# Patient Record
Sex: Female | Born: 1955
Health system: Southern US, Community
[De-identification: ages and names within clinical notes are randomized; demographics above are authoritative.]

## PROBLEM LIST (undated history)

## (undated) DIAGNOSIS — Z9889 Other specified postprocedural states: Secondary | ICD-10-CM

## (undated) DIAGNOSIS — R112 Nausea with vomiting, unspecified: Secondary | ICD-10-CM

## (undated) DIAGNOSIS — F32A Depression, unspecified: Secondary | ICD-10-CM

## (undated) DIAGNOSIS — R519 Headache, unspecified: Secondary | ICD-10-CM

## (undated) DIAGNOSIS — A6000 Herpesviral infection of urogenital system, unspecified: Secondary | ICD-10-CM

## (undated) DIAGNOSIS — D649 Anemia, unspecified: Secondary | ICD-10-CM

## (undated) DIAGNOSIS — F329 Major depressive disorder, single episode, unspecified: Secondary | ICD-10-CM

## (undated) DIAGNOSIS — K219 Gastro-esophageal reflux disease without esophagitis: Secondary | ICD-10-CM

## (undated) DIAGNOSIS — Z803 Family history of malignant neoplasm of breast: Secondary | ICD-10-CM

## (undated) DIAGNOSIS — R51 Headache: Secondary | ICD-10-CM

## (undated) HISTORY — DX: Family history of malignant neoplasm of breast: Z80.3

## (undated) HISTORY — DX: Depression, unspecified: F32.A

## (undated) HISTORY — PX: FOOT SURGERY: SHX648

## (undated) HISTORY — DX: Gastro-esophageal reflux disease without esophagitis: K21.9

## (undated) HISTORY — PX: WRIST SURGERY: SHX841

## (undated) HISTORY — DX: Herpesviral infection of urogenital system, unspecified: A60.00

## (undated) HISTORY — DX: Major depressive disorder, single episode, unspecified: F32.9

---

## 2007-11-14 ENCOUNTER — Emergency Department: Payer: Self-pay | Admitting: Emergency Medicine

## 2007-11-14 ENCOUNTER — Other Ambulatory Visit: Payer: Self-pay

## 2008-02-13 ENCOUNTER — Ambulatory Visit: Payer: Self-pay | Admitting: Gastroenterology

## 2008-12-23 ENCOUNTER — Ambulatory Visit: Payer: Self-pay | Admitting: Anesthesiology

## 2013-01-09 ENCOUNTER — Emergency Department: Payer: Self-pay | Admitting: Emergency Medicine

## 2013-01-09 LAB — URINALYSIS, COMPLETE
Bacteria: NONE SEEN
Bilirubin,UR: NEGATIVE
Glucose,UR: NEGATIVE mg/dL (ref 0–75)
Ketone: NEGATIVE
Nitrite: NEGATIVE
Ph: 5 (ref 4.5–8.0)
Protein: NEGATIVE
RBC,UR: 6 /HPF (ref 0–5)
Specific Gravity: 1.028 (ref 1.003–1.030)
Squamous Epithelial: 1
WBC UR: 6 /HPF (ref 0–5)

## 2013-01-09 LAB — CBC
HCT: 38.1 % (ref 35.0–47.0)
HGB: 13.2 g/dL (ref 12.0–16.0)
MCH: 30 pg (ref 26.0–34.0)
MCHC: 34.5 g/dL (ref 32.0–36.0)
MCV: 87 fL (ref 80–100)
Platelet: 234 10*3/uL (ref 150–440)
RBC: 4.38 10*6/uL (ref 3.80–5.20)
RDW: 13.6 % (ref 11.5–14.5)
WBC: 5.4 10*3/uL (ref 3.6–11.0)

## 2013-01-09 LAB — COMPREHENSIVE METABOLIC PANEL
Albumin: 3.8 g/dL (ref 3.4–5.0)
Alkaline Phosphatase: 80 U/L (ref 50–136)
Anion Gap: 4 — ABNORMAL LOW (ref 7–16)
BUN: 15 mg/dL (ref 7–18)
Bilirubin,Total: 0.2 mg/dL (ref 0.2–1.0)
Calcium, Total: 9.1 mg/dL (ref 8.5–10.1)
Chloride: 105 mmol/L (ref 98–107)
Co2: 29 mmol/L (ref 21–32)
Creatinine: 0.75 mg/dL (ref 0.60–1.30)
EGFR (African American): >60
EGFR (Non-African Amer.): >60
Glucose: 91 mg/dL (ref 65–99)
Osmolality: 276 (ref 275–301)
Potassium: 4 mmol/L (ref 3.5–5.1)
SGOT(AST): 28 U/L (ref 15–37)
SGPT (ALT): 33 U/L (ref 12–78)
Sodium: 138 mmol/L (ref 136–145)
Total Protein: 7 g/dL (ref 6.4–8.2)

## 2013-01-09 LAB — LIPASE, BLOOD: Lipase: 232 U/L (ref 73–393)

## 2013-01-20 ENCOUNTER — Ambulatory Visit: Payer: Self-pay | Admitting: Family Medicine

## 2013-03-02 ENCOUNTER — Emergency Department: Payer: Self-pay | Admitting: Emergency Medicine

## 2013-04-21 ENCOUNTER — Ambulatory Visit: Payer: Self-pay | Admitting: Family Medicine

## 2014-07-20 ENCOUNTER — Ambulatory Visit: Payer: Self-pay | Admitting: Family Medicine

## 2014-07-24 LAB — HM PAP SMEAR: HM Pap smear: NEGATIVE

## 2014-07-24 LAB — HM MAMMOGRAPHY: HM Mammogram: NORMAL (ref 0–4)

## 2014-10-29 ENCOUNTER — Other Ambulatory Visit: Payer: Self-pay

## 2014-11-10 ENCOUNTER — Ambulatory Visit (INDEPENDENT_AMBULATORY_CARE_PROVIDER_SITE_OTHER): Payer: BLUE CROSS/BLUE SHIELD | Admitting: Family Medicine

## 2014-11-10 ENCOUNTER — Encounter (INDEPENDENT_AMBULATORY_CARE_PROVIDER_SITE_OTHER): Payer: Self-pay

## 2014-11-10 ENCOUNTER — Encounter: Payer: Self-pay | Admitting: Family Medicine

## 2014-11-10 VITALS — BP 94/64 | HR 80 | Temp 98.0°F | Ht 64.0 in | Wt 127.6 lb

## 2014-11-10 DIAGNOSIS — R399 Unspecified symptoms and signs involving the genitourinary system: Secondary | ICD-10-CM

## 2014-11-10 DIAGNOSIS — F419 Anxiety disorder, unspecified: Secondary | ICD-10-CM

## 2014-11-10 DIAGNOSIS — F410 Panic disorder [episodic paroxysmal anxiety] without agoraphobia: Secondary | ICD-10-CM | POA: Insufficient documentation

## 2014-11-10 DIAGNOSIS — K219 Gastro-esophageal reflux disease without esophagitis: Secondary | ICD-10-CM | POA: Insufficient documentation

## 2014-11-10 DIAGNOSIS — B009 Herpesviral infection, unspecified: Secondary | ICD-10-CM | POA: Insufficient documentation

## 2014-11-10 DIAGNOSIS — N951 Menopausal and female climacteric states: Secondary | ICD-10-CM | POA: Insufficient documentation

## 2014-11-10 DIAGNOSIS — N200 Calculus of kidney: Secondary | ICD-10-CM | POA: Insufficient documentation

## 2014-11-10 DIAGNOSIS — Z1159 Encounter for screening for other viral diseases: Secondary | ICD-10-CM | POA: Insufficient documentation

## 2014-11-10 MED ORDER — ALPRAZOLAM 0.5 MG PO TABS: 0.5000 mg | ORAL_TABLET | Freq: Three times a day (TID) | ORAL | Status: DC | PRN

## 2014-11-10 NOTE — Progress Notes (Signed)
Name: Christine Ayala   MRN: [id number]    DOB: 1956/02/09   Date:11/10/2014       Progress Note  Subjective  Chief Complaint  Chief Complaint  Patient presents with  . Follow-up    3 month check and meds  . Memory Loss    Anxiety Presents for follow-up visit. The problem has been unchanged. Symptoms include excessive worry, hyperventilation, muscle tension, nervous/anxious behavior, restlessness and shortness of breath. Patient reports no chest pain. Symptoms occur most days.   Past treatments include benzodiazephines. The treatment provided moderate relief. Compliance with prior treatments has been good.  Patient wants to be tested for a urinary tract infection. She is experiencing bilateral flank pain, suprapubic pain, and urinary urgency. No fevers or chills.    Past Medical History  Diagnosis Date  . Panic attacks   . GERD (gastroesophageal reflux disease)   . Herpes genitalis   . Depression     Past Surgical History  Procedure Laterality Date  . Cesarean section  1978  . Foot surgery    . Wrist surgery      No family history on file.  History   Social History  . Marital Status: Married    Spouse Name: N/A  . Number of Children: N/A  . Years of Education: N/A   Occupational History  . Not on file.   Social History Main Topics  . Smoking status: Never Smoker   . Smokeless tobacco: Never Used  . Alcohol Use: No  . Drug Use: No  . Sexual Activity: Not on file   Other Topics Concern  . Not on file   Social History Narrative  . No narrative on file     Current outpatient prescriptions:  .  ALPRAZolam (XANAX) 0.5 MG tablet, Take by mouth., Disp: , Rfl:  .  lansoprazole (PREVACID) 30 MG capsule, Take by mouth., Disp: , Rfl:  .  valACYclovir (VALTREX) 500 MG tablet, Take by mouth., Disp: , Rfl:   Allergies  Allergen Reactions  . Tetracycline      Review of Systems  Respiratory: Positive for shortness of breath.   Cardiovascular: Negative for chest  pain.  Genitourinary: Positive for urgency, frequency, hematuria and flank pain. Negative for dysuria.  Psychiatric/Behavioral: The patient is nervous/anxious.       Objective  Filed Vitals:   11/10/14 1626  BP: 94/64  Pulse: 80  Temp: 98 F (36.7 C)  TempSrc: Oral  Height: 5\' 4"  (1.626 m)  Weight: 127 lb 9.6 oz (57.879 kg)    Physical Exam  Constitutional: She is oriented to person, place, and time and well-developed, well-nourished, and in no distress.  HENT:  Head: Normocephalic and atraumatic.  Cardiovascular: Normal rate and regular rhythm.   Pulmonary/Chest: Effort normal and breath sounds normal.  Abdominal: Soft. Bowel sounds are normal. There is tenderness (In the suprapubic area). There is no CVA tenderness.    Neurological: She is alert and oriented to person, place, and time.  Skin: Skin is warm and dry.  Psychiatric: Mood, memory, affect and judgment normal.  Nursing note and vitals reviewed.      No results found for this or any previous visit (from the past 2160 hour(s)).   Assessment & Plan 1. Anxiety Symptoms stable on present therapy. Refills provided. - ALPRAZolam (XANAX) 0.5 MG tablet; Take 1 tablet (0.5 mg total) by mouth 3 (three) times daily as needed for anxiety.  Dispense: 90 tablet; Refill: 0  2. UTI symptoms Patient has  symptoms consistent with a UTI. We will obtain a urinalysis and culture if indicated and follow-up. - Urinalysis, Routine w reflex microscopic    Brandii Lakey Asad A. [facility] 11/10/2014 4:56 PM

## 2014-11-24 ENCOUNTER — Encounter: Payer: Self-pay | Admitting: Family Medicine

## 2014-11-24 LAB — URINALYSIS, ROUTINE W REFLEX MICROSCOPIC
Bilirubin, UA: NEGATIVE
Glucose, UA: NEGATIVE
Ketones, UA: NEGATIVE
Leukocytes, UA: NEGATIVE
Nitrite, UA: NEGATIVE
Protein, UA: NEGATIVE
RBC, UA: NEGATIVE
Specific Gravity, UA: 1.014 (ref 1.005–1.030)
Urobilinogen, Ur: 0.2 mg/dL (ref 0.2–1.0)
pH, UA: 6 (ref 5.0–7.5)

## 2014-11-25 ENCOUNTER — Other Ambulatory Visit: Payer: Self-pay | Admitting: Family Medicine

## 2014-11-25 ENCOUNTER — Ambulatory Visit (INDEPENDENT_AMBULATORY_CARE_PROVIDER_SITE_OTHER): Payer: BLUE CROSS/BLUE SHIELD | Admitting: Family Medicine

## 2014-11-25 ENCOUNTER — Encounter: Payer: Self-pay | Admitting: Family Medicine

## 2014-11-25 VITALS — BP 100/66 | HR 101 | Temp 97.7°F | Resp 17 | Ht 64.0 in | Wt 128.4 lb

## 2014-11-25 DIAGNOSIS — R413 Other amnesia: Secondary | ICD-10-CM | POA: Diagnosis not present

## 2014-11-25 NOTE — Progress Notes (Signed)
Name: Christine Ayala   MRN: [id number]    DOB: 04-17-1956   Date:11/25/2014       Progress Note  Subjective  Chief Complaint  Chief Complaint  Patient presents with  . Follow-up    Discuss test for short term memory (from last exam)    HPI Pt. Is here for concerns about memory impairment. She is experiencing difficulty with short term recall. She is in school and easily forgets something that she read in her textbook and has to read it over and over again. She also complains about forgetting things such as why she went in to the kitchen for. Her kids frequently tell her that she keeps repeating the same information to them again and again. She has to ask her husband for driving directions to frequently visited places every single time. This has been going on for over 3-4 months. She has no hx of head trauma, no recent illnesses, she is not on a statin and no symptoms of inattention or easily losing focus. She does complain of fatigue and frequently feeling tired. Otherwise she feels well and healthy.   Past Medical History  Diagnosis Date  . Panic attacks   . GERD (gastroesophageal reflux disease)   . Herpes genitalis   . Depression     Past Surgical History  Procedure Laterality Date  . Cesarean section  1978  . Foot surgery    . Wrist surgery      History reviewed. No pertinent family history.  History   Social History  . Marital Status: Married    Spouse Name: N/A  . Number of Children: N/A  . Years of Education: N/A   Occupational History  . Not on file.   Social History Main Topics  . Smoking status: Never Smoker   . Smokeless tobacco: Never Used  . Alcohol Use: No  . Drug Use: No  . Sexual Activity: Not on file   Other Topics Concern  . Not on file   Social History Narrative     Current outpatient prescriptions:  .  ALPRAZolam (XANAX) 0.5 MG tablet, Take 1 tablet (0.5 mg total) by mouth 3 (three) times daily as needed for anxiety., Disp: 90 tablet, Rfl:  0 .  lansoprazole (PREVACID) 30 MG capsule, Take by mouth., Disp: , Rfl:  .  valACYclovir (VALTREX) 500 MG tablet, Take by mouth., Disp: , Rfl:   Allergies  Allergen Reactions  . Tetracycline      Review of Systems  Constitutional: Positive for malaise/fatigue. Negative for fever, chills and weight loss.  Neurological: Negative for dizziness, seizures and loss of consciousness. Headaches: does complain of frequent headaches.  Psychiatric/Behavioral: Positive for memory loss. Negative for depression. The patient is nervous/anxious.     Objective  Filed Vitals:   11/25/14 1023  BP: 100/66  Pulse: 101  Temp: 97.7 F (36.5 C)  TempSrc: Oral  Resp: 17  Height: 5\' 4"  (1.626 m)  Weight: 128 lb 6.4 oz (58.242 kg)  SpO2: 94%    Physical Exam  Constitutional: She is oriented to person, place, and time and well-developed, well-nourished, and in no distress.  HENT:  Head: Normocephalic and atraumatic.  Cardiovascular: Normal rate and regular rhythm.   Pulmonary/Chest: Effort normal and breath sounds normal.  Neurological: She is alert and oriented to person, place, and time. No cranial nerve deficit.  Skin: Skin is warm and dry.  Psychiatric: Affect normal.  Nursing note and vitals reviewed.    Recent Results (from the  past 2160 hour(s))  Urinalysis, Routine w reflex microscopic     Status: None   Collection Time: 11/23/14 12:27 PM  Result Value Ref Range   Specific Gravity, UA 1.014 1.005 - 1.030   pH, UA 6.0 5.0 - 7.5   Color, UA Yellow Yellow   Appearance Ur Clear Clear   Leukocytes, UA Negative Negative   Protein, UA Negative Negative/Trace   Glucose, UA Negative Negative   Ketones, UA Negative Negative   RBC, UA Negative Negative   Bilirubin, UA Negative Negative   Urobilinogen, Ur 0.2 0.2 - 1.0 mg/dL   Nitrite, UA Negative Negative   Microscopic Examination Comment     Comment: Microscopic not indicated and not performed.     Assessment & Plan  1. Memory  impairment MMSE score is 29 out of 30, which is within normal range. We will start evaluation with lab work and an MRI of brain without contrast. Follow-up after review of lab and imaging results. - CBC with Differential - Comprehensive Metabolic Panel (CMET) - TSH - HIV antibody (with reflex) - B12 - RPR - MR Brain Wo Contrast; Future   Rex Magee Asad A. [facility] 11/25/2014 10:51 AM

## 2014-11-26 LAB — CBC WITH DIFFERENTIAL/PLATELET
Basophils Absolute: 0.1 10*3/uL (ref 0.0–0.2)
Basos: 1 %
EOS (ABSOLUTE): 0.2 10*3/uL (ref 0.0–0.4)
Eos: 4 %
Hematocrit: 39.6 % (ref 34.0–46.6)
Hemoglobin: 13.3 g/dL (ref 11.1–15.9)
Immature Grans (Abs): 0 10*3/uL (ref 0.0–0.1)
Immature Granulocytes: 0 %
Lymphocytes Absolute: 1.8 10*3/uL (ref 0.7–3.1)
Lymphs: 36 %
MCH: 28.7 pg (ref 26.6–33.0)
MCHC: 33.6 g/dL (ref 31.5–35.7)
MCV: 86 fL (ref 79–97)
Monocytes Absolute: 0.4 10*3/uL (ref 0.1–0.9)
Monocytes: 8 %
Neutrophils Absolute: 2.6 10*3/uL (ref 1.4–7.0)
Neutrophils: 51 %
Platelets: 243 10*3/uL (ref 150–379)
RBC: 4.63 x10E6/uL (ref 3.77–5.28)
RDW: 13.7 % (ref 12.3–15.4)
WBC: 5.1 10*3/uL (ref 3.4–10.8)

## 2014-11-26 LAB — COMPREHENSIVE METABOLIC PANEL
ALT: 17 IU/L (ref 0–32)
AST: 20 IU/L (ref 0–40)
Albumin/Globulin Ratio: 2.1 (ref 1.1–2.5)
Albumin: 4.5 g/dL (ref 3.5–5.5)
Alkaline Phosphatase: 71 IU/L (ref 39–117)
BUN/Creatinine Ratio: 14 (ref 9–23)
BUN: 11 mg/dL (ref 6–24)
Bilirubin Total: <0.2 mg/dL (ref 0.0–1.2)
CO2: 25 mmol/L (ref 18–29)
Calcium: 9.9 mg/dL (ref 8.7–10.2)
Chloride: 104 mmol/L (ref 97–108)
Creatinine, Ser: 0.77 mg/dL (ref 0.57–1.00)
GFR calc Af Amer: 100 mL/min/{1.73_m2} (ref >59)
GFR calc non Af Amer: 87 mL/min/{1.73_m2} (ref >59)
Globulin, Total: 2.1 g/dL (ref 1.5–4.5)
Glucose: 84 mg/dL (ref 65–99)
Potassium: 4.7 mmol/L (ref 3.5–5.2)
Sodium: 145 mmol/L — ABNORMAL HIGH (ref 134–144)
Total Protein: 6.6 g/dL (ref 6.0–8.5)

## 2014-11-26 LAB — RPR: RPR Ser Ql: NONREACTIVE

## 2014-11-26 LAB — HIV ANTIBODY (ROUTINE TESTING W REFLEX): HIV Screen 4th Generation wRfx: NONREACTIVE

## 2014-11-26 LAB — TSH: TSH: 2.96 u[IU]/mL (ref 0.450–4.500)

## 2014-11-26 LAB — VITAMIN B12: Vitamin B-12: 393 pg/mL (ref 211–946)

## 2014-12-03 ENCOUNTER — Ambulatory Visit
Admission: RE | Admit: 2014-12-03 | Discharge: 2014-12-03 | Disposition: A | Discharge status: Home / Self Care | Payer: BLUE CROSS/BLUE SHIELD | Source: Ambulatory Visit | Attending: Family Medicine | Admitting: Family Medicine

## 2014-12-03 DIAGNOSIS — R413 Other amnesia: Secondary | ICD-10-CM | POA: Diagnosis present

## 2014-12-21 ENCOUNTER — Telehealth: Payer: Self-pay | Admitting: Family Medicine

## 2014-12-21 ENCOUNTER — Other Ambulatory Visit: Payer: Self-pay | Admitting: Family Medicine

## 2014-12-21 DIAGNOSIS — F419 Anxiety disorder, unspecified: Secondary | ICD-10-CM

## 2014-12-21 NOTE — Telephone Encounter (Signed)
Patient is requesting medication refill on Xanax and is asking for 3 mo supply, routed to Dr. Manuella Ghazi

## 2014-12-21 NOTE — Telephone Encounter (Signed)
PT SAID THAT THE DR ONLY SENT IN 1 REFILL ON ALPRAZOLAM AND HE USUALLY SENDS IN A 90 DAY SUPPLY. SHE STATED THAT SHE JUST HAD A VISIT WITH HIM A FEW WKS AGO. [facility] [place].

## 2014-12-22 MED ORDER — ALPRAZOLAM 0.5 MG PO TABS: 0.5000 mg | ORAL_TABLET | Freq: Three times a day (TID) | ORAL | Status: DC | PRN

## 2015-01-14 ENCOUNTER — Encounter: Payer: Self-pay | Admitting: Family Medicine

## 2015-02-03 ENCOUNTER — Encounter: Payer: Self-pay | Admitting: Family Medicine

## 2015-02-03 ENCOUNTER — Ambulatory Visit (INDEPENDENT_AMBULATORY_CARE_PROVIDER_SITE_OTHER): Payer: BLUE CROSS/BLUE SHIELD | Admitting: Family Medicine

## 2015-02-03 VITALS — BP 103/77 | HR 92 | Temp 98.5°F | Resp 18 | Ht 64.0 in | Wt 126.2 lb

## 2015-02-03 DIAGNOSIS — F419 Anxiety disorder, unspecified: Secondary | ICD-10-CM

## 2015-02-03 DIAGNOSIS — J0110 Acute frontal sinusitis, unspecified: Secondary | ICD-10-CM

## 2015-02-03 MED ORDER — AZITHROMYCIN 250 MG PO TABS: ORAL_TABLET | ORAL | Status: DC

## 2015-02-03 MED ORDER — ALPRAZOLAM 0.5 MG PO TABS: 0.5000 mg | ORAL_TABLET | Freq: Three times a day (TID) | ORAL | Status: DC | PRN

## 2015-02-03 NOTE — Progress Notes (Signed)
Name: Christine Ayala   MRN: [id number]    DOB: 25-Sep-1955   Date:02/03/2015       Progress Note  Subjective  Chief Complaint  Chief Complaint  Patient presents with  . Acute Visit    sinus infection  . Medication Refill    Xanax 0.5 mg    Sinusitis This is a new problem. The current episode started yesterday. There has been no fever. Associated symptoms include coughing, shortness of breath, sinus pressure and a sore throat. Pertinent negatives include no chills. Past treatments include oral decongestants.  Anxiety Presents for follow-up visit. Symptoms include depressed mood, excessive worry, nervous/anxious behavior, panic and shortness of breath. Patient reports no insomnia. The severity of symptoms is moderate.   Past treatments include benzodiazephines.    Past Medical History  Diagnosis Date  . Panic attacks   . GERD (gastroesophageal reflux disease)   . Herpes genitalis   . Depression     Past Surgical History  Procedure Laterality Date  . Cesarean section  1978  . Foot surgery    . Wrist surgery      History reviewed. No pertinent family history.  Social History   Social History  . Marital Status: Married    Spouse Name: N/A  . Number of Children: N/A  . Years of Education: N/A   Occupational History  . Not on file.   Social History Main Topics  . Smoking status: Never Smoker   . Smokeless tobacco: Never Used  . Alcohol Use: No  . Drug Use: No  . Sexual Activity: Not on file   Other Topics Concern  . Not on file   Social History Narrative     Current outpatient prescriptions:  .  ALPRAZolam (XANAX) 0.5 MG tablet, Take 1 tablet (0.5 mg total) by mouth 3 (three) times daily as needed for anxiety., Disp: 90 tablet, Rfl: 1 .  lansoprazole (PREVACID) 30 MG capsule, Take by mouth., Disp: , Rfl:  .  valACYclovir (VALTREX) 500 MG tablet, Take by mouth., Disp: , Rfl:   Allergies  Allergen Reactions  . Tetracycline     Review of Systems   Constitutional: Negative for fever and chills.  HENT: Positive for sinus pressure and sore throat.   Respiratory: Positive for cough and shortness of breath.   Psychiatric/Behavioral: The patient is nervous/anxious. The patient does not have insomnia.     Objective  Filed Vitals:   02/03/15 0924  BP: 103/77  Pulse: 92  Temp: 98.5 F (36.9 C)  TempSrc: Oral  Resp: 18  Height: 5\' 4"  (1.626 m)  Weight: 126 lb 3.2 oz (57.244 kg)  SpO2: 94%    Physical Exam  Constitutional: She is oriented to person, place, and time and well-developed, well-nourished, and in no distress.  HENT:  Nose: Right sinus exhibits maxillary sinus tenderness and frontal sinus tenderness. Left sinus exhibits maxillary sinus tenderness and frontal sinus tenderness.  Mouth/Throat: Posterior oropharyngeal erythema present.  Cardiovascular: Normal rate and regular rhythm.   Pulmonary/Chest: Effort normal and breath sounds normal.  Neurological: She is alert and oriented to person, place, and time.  Psychiatric: Memory, affect and judgment normal.  Nursing note and vitals reviewed.  Assessment & Plan  1. Acute frontal sinusitis, recurrence not specified Recommended NSAIDs for pain relief and increased fluid intake.  - azithromycin (ZITHROMAX Z-PAK) 250 MG tablet; 2 tabs po x day 1, then 1 tab po q day x 4 days  Dispense: 6 each; Refill: 0  2. Anxiety  Recurrent symptoms of anxiety stable and controlled on alprazolam as needed. Refills provided. Patient aware of the dependence potential for alprazolam and is taking the medication as directed and only as needed.  - ALPRAZolam (XANAX) 0.5 MG tablet; Take 1 tablet (0.5 mg total) by mouth 3 (three) times daily as needed for anxiety.  Dispense: 90 tablet; Refill: 1   Syed Asad A. [facility] 02/03/2015 9:53 AM

## 2015-04-13 ENCOUNTER — Telehealth: Payer: Self-pay | Admitting: Family Medicine

## 2015-04-13 DIAGNOSIS — F419 Anxiety disorder, unspecified: Secondary | ICD-10-CM

## 2015-04-13 NOTE — Telephone Encounter (Signed)
Patient needs an appointment to discuss possible symptoms of ADHD and refill for alprazolam.

## 2015-04-13 NOTE — Telephone Encounter (Signed)
Requesting refill on alprazolam 0.5mg  also she is checking status on her referral appointment pertaining to poss adhd or short memory span. She says that dr Manuella Ghazi nurse was going to call her once the appointment was made. Stated that this was about 2 months ago and is not sure if the test was going to be done here in office or if she was going to be sent out to a different doctor. Also, patient is needing referral for colonoscopy.

## 2015-04-13 NOTE — Telephone Encounter (Signed)
Routed to Dr. Shah for approval 

## 2015-04-26 ENCOUNTER — Ambulatory Visit (INDEPENDENT_AMBULATORY_CARE_PROVIDER_SITE_OTHER): Payer: BLUE CROSS/BLUE SHIELD | Admitting: Family Medicine

## 2015-04-26 ENCOUNTER — Encounter: Payer: Self-pay | Admitting: Family Medicine

## 2015-04-26 ENCOUNTER — Telehealth: Payer: Self-pay

## 2015-04-26 VITALS — BP 108/80 | HR 108 | Temp 98.8°F | Resp 17 | Ht 64.0 in | Wt 125.8 lb

## 2015-04-26 DIAGNOSIS — J019 Acute sinusitis, unspecified: Secondary | ICD-10-CM | POA: Diagnosis not present

## 2015-04-26 DIAGNOSIS — J0120 Acute ethmoidal sinusitis, unspecified: Secondary | ICD-10-CM | POA: Insufficient documentation

## 2015-04-26 DIAGNOSIS — Z1211 Encounter for screening for malignant neoplasm of colon: Secondary | ICD-10-CM | POA: Diagnosis not present

## 2015-04-26 DIAGNOSIS — F419 Anxiety disorder, unspecified: Secondary | ICD-10-CM

## 2015-04-26 MED ORDER — AMOXICILLIN-POT CLAVULANATE 875-125 MG PO TABS: 1.0000 | ORAL_TABLET | Freq: Two times a day (BID) | ORAL | Status: DC

## 2015-04-26 MED ORDER — ALPRAZOLAM 0.5 MG PO TABS: 0.5000 mg | ORAL_TABLET | Freq: Three times a day (TID) | ORAL | Status: DC | PRN

## 2015-04-26 NOTE — Progress Notes (Signed)
Name: Christine Ayala   MRN: [id number]    DOB: February 14, 1956   Date:04/26/2015       Progress Note  Subjective  Chief Complaint  Chief Complaint  Patient presents with  . Acute Visit    Sinus  . Referral    Colonscopy   Sinusitis This is a recurrent problem. The current episode started in the past 7 days (3 days). There has been no fever. The pain is moderate. Associated symptoms include headaches, sinus pressure and a sore throat. Pertinent negatives include no chills, coughing or ear pain. Treatments tried: Flonase, which has provided relief.   Pt. Is here for a referral to obtain screening colonoscopy. Last colonoscopy was 7 years ago, was reportedly normal.  No history of clon cancer in family. Father had polyps which were non-cancerous and had 17 inches of his colon removed.   Past Medical History  Diagnosis Date  . Panic attacks   . GERD (gastroesophageal reflux disease)   . Herpes genitalis   . Depression     Past Surgical History  Procedure Laterality Date  . Cesarean section  1978  . Foot surgery    . Wrist surgery      History reviewed. No pertinent family history.  Social History   Social History  . Marital Status: Married    Spouse Name: N/A  . Number of Children: N/A  . Years of Education: N/A   Occupational History  . Not on file.   Social History Main Topics  . Smoking status: Never Smoker   . Smokeless tobacco: Never Used  . Alcohol Use: No  . Drug Use: No  . Sexual Activity: Not on file   Other Topics Concern  . Not on file   Social History Narrative     Current outpatient prescriptions:  .  ALPRAZolam (XANAX) 0.5 MG tablet, Take 1 tablet (0.5 mg total) by mouth 3 (three) times daily as needed for anxiety., Disp: 90 tablet, Rfl: 1 .  lansoprazole (PREVACID) 30 MG capsule, Take by mouth., Disp: , Rfl:  .  valACYclovir (VALTREX) 500 MG tablet, Take by mouth., Disp: , Rfl:  .  azithromycin (ZITHROMAX Z-PAK) 250 MG tablet, 2 tabs po x day 1,  then 1 tab po q day x 4 days (Patient not taking: Reported on 04/26/2015), Disp: 6 each, Rfl: 0  Allergies  Allergen Reactions  . Tetracycline     Review of Systems  Constitutional: Negative for fever and chills.  HENT: Positive for sinus pressure and sore throat. Negative for ear pain.   Respiratory: Negative for cough.   Cardiovascular: Negative for chest pain.  Neurological: Positive for headaches.    Objective  Filed Vitals:   04/26/15 1050  BP: 108/80  Pulse: 108  Temp: 98.8 F (37.1 C)  TempSrc: Oral  Resp: 17  Height: 5\' 4"  (1.626 m)  Weight: 125 lb 12.8 oz (57.063 kg)  SpO2: 96%    Physical Exam  Constitutional: She is well-developed, well-nourished, and in no distress.  HENT:  Right Ear: Ear canal normal.  Left Ear: Ear canal normal.  Nose: Rhinorrhea and sinus tenderness present. Right sinus exhibits no maxillary sinus tenderness and no frontal sinus tenderness. Left sinus exhibits no maxillary sinus tenderness and no frontal sinus tenderness.  Mouth/Throat: Mucous membranes are normal. Posterior oropharyngeal edema and posterior oropharyngeal erythema present.  Cerumen impaction, unable to visualize TM Nasal mucosa is edematous, erythematous,   Cardiovascular: Normal rate, regular rhythm and normal heart sounds.  No murmur heard. Pulmonary/Chest: Effort normal and breath sounds normal.  Nursing note and vitals reviewed.    Assessment & Plan  1. Acute sinusitis, recurrence not specified, unspecified location Acute early sinusitis. Will treat with antibiotic therapy. Encouraged increased fluid intake. - amoxicillin-clavulanate (AUGMENTIN) 875-125 MG tablet; Take 1 tablet by mouth 2 (two) times daily.  Dispense: 20 tablet; Refill: 0  2. Screening for colon cancer  - Ambulatory referral to Gastroenterology   Dossie Der Asad A. [facility] 04/26/2015 11:43 AM

## 2015-04-26 NOTE — Telephone Encounter (Signed)
Medication has been refilled and given to patient at office visit by Dr. Manuella Ghazi

## 2015-05-03 ENCOUNTER — Other Ambulatory Visit: Payer: Self-pay

## 2015-05-03 ENCOUNTER — Telehealth: Payer: Self-pay | Admitting: Gastroenterology

## 2015-05-03 NOTE — Telephone Encounter (Signed)
Gastroenterology Pre-Procedure Review  Request Date: 05-14-15 Requesting Physician: Dr.   PATIENT REVIEW QUESTIONS: The patient responded to the following health history questions as indicated:    1. Are you having any GI issues? no 2. Do you have a personal history of Polyps? NO 3. Do you have a family history of Colon Cancer or Polyps? Dad polyps 4. Diabetes Mellitus? no 5. Joint replacements in the past 12 months?no 6. Major health problems in the past 3 months?NO 7. Any artificial heart valves, MVP, or defibrillator?no    MEDICATIONS & ALLERGIES:    Patient reports the following regarding taking any anticoagulation/antiplatelet therapy:   Plavix, Coumadin, Eliquis, Xarelto, Lovenox, Pradaxa, Brilinta, or Effient? no Aspirin? no  Patient confirms/reports the following medications:  Current Outpatient Prescriptions  Medication Sig Dispense Refill   ALPRAZolam (XANAX) 0.5 MG tablet Take 1 tablet (0.5 mg total) by mouth 3 (three) times daily as needed for anxiety. 90 tablet 0   amoxicillin-clavulanate (AUGMENTIN) 875-125 MG tablet Take 1 tablet by mouth 2 (two) times daily. 20 tablet 0   azithromycin (ZITHROMAX Z-PAK) 250 MG tablet 2 tabs po x day 1, then 1 tab po q day x 4 days (Patient not taking: Reported on 04/26/2015) 6 each 0   lansoprazole (PREVACID) 30 MG capsule Take by mouth.     valACYclovir (VALTREX) 500 MG tablet Take by mouth.     No current facility-administered medications for this visit.    Patient confirms/reports the following allergies:  Allergies  Allergen Reactions   Tetracycline     No orders of the defined types were placed in this encounter.    AUTHORIZATION INFORMATION Primary Insurance: 1D#: Group #:  Secondary Insurance: 1D#: Group #:  SCHEDULE INFORMATION: Date: 05-14-15 Time: Location:[facility]

## 2015-05-06 ENCOUNTER — Encounter: Payer: Self-pay | Admitting: *Deleted

## 2015-05-14 ENCOUNTER — Encounter: Payer: Self-pay | Admitting: Gastroenterology

## 2015-05-14 ENCOUNTER — Ambulatory Visit: Payer: BLUE CROSS/BLUE SHIELD | Admitting: Anesthesiology

## 2015-05-14 ENCOUNTER — Ambulatory Visit
Admission: RE | Admit: 2015-05-14 | Discharge: 2015-05-14 | Disposition: A | Discharge status: Home / Self Care | Payer: BLUE CROSS/BLUE SHIELD | Source: Ambulatory Visit | Attending: Gastroenterology | Admitting: Gastroenterology

## 2015-05-14 ENCOUNTER — Encounter
Admission: RE | Disposition: A | Discharge status: Home / Self Care | Payer: Self-pay | Source: Ambulatory Visit | Attending: Gastroenterology

## 2015-05-14 DIAGNOSIS — D649 Anemia, unspecified: Secondary | ICD-10-CM | POA: Insufficient documentation

## 2015-05-14 DIAGNOSIS — Z87891 Personal history of nicotine dependence: Secondary | ICD-10-CM | POA: Diagnosis not present

## 2015-05-14 DIAGNOSIS — A6000 Herpesviral infection of urogenital system, unspecified: Secondary | ICD-10-CM | POA: Diagnosis not present

## 2015-05-14 DIAGNOSIS — F410 Panic disorder [episodic paroxysmal anxiety] without agoraphobia: Secondary | ICD-10-CM | POA: Diagnosis not present

## 2015-05-14 DIAGNOSIS — F329 Major depressive disorder, single episode, unspecified: Secondary | ICD-10-CM | POA: Diagnosis not present

## 2015-05-14 DIAGNOSIS — Z1211 Encounter for screening for malignant neoplasm of colon: Secondary | ICD-10-CM | POA: Insufficient documentation

## 2015-05-14 DIAGNOSIS — G43909 Migraine, unspecified, not intractable, without status migrainosus: Secondary | ICD-10-CM | POA: Diagnosis not present

## 2015-05-14 DIAGNOSIS — K641 Second degree hemorrhoids: Secondary | ICD-10-CM | POA: Diagnosis not present

## 2015-05-14 DIAGNOSIS — K219 Gastro-esophageal reflux disease without esophagitis: Secondary | ICD-10-CM | POA: Insufficient documentation

## 2015-05-14 HISTORY — DX: Anemia, unspecified: D64.9

## 2015-05-14 HISTORY — DX: Headache, unspecified: R51.9

## 2015-05-14 HISTORY — DX: Nausea with vomiting, unspecified: R11.2

## 2015-05-14 HISTORY — DX: Headache: R51

## 2015-05-14 HISTORY — PX: COLONOSCOPY WITH PROPOFOL: SHX5780

## 2015-05-14 HISTORY — DX: Nausea with vomiting, unspecified: Z98.890

## 2015-05-14 SURGERY — COLONOSCOPY WITH PROPOFOL
Anesthesia: Monitor Anesthesia Care | Wound class: Contaminated

## 2015-05-14 MED ORDER — LACTATED RINGERS IV SOLN
INTRAVENOUS | Status: DC
  Administered 2015-05-14: 09:00:00 via INTRAVENOUS

## 2015-05-14 MED ORDER — PROMETHAZINE HCL 25 MG/ML IJ SOLN: 6.2500 mg | INTRAMUSCULAR | Status: DC | PRN

## 2015-05-14 MED ORDER — LIDOCAINE HCL (CARDIAC) 20 MG/ML IV SOLN
INTRAVENOUS | Status: DC | PRN
  Administered 2015-05-14: 50 mg via INTRAVENOUS

## 2015-05-14 MED ORDER — PROPOFOL 10 MG/ML IV BOLUS
INTRAVENOUS | Status: DC | PRN
  Administered 2015-05-14: 20 mg via INTRAVENOUS
  Administered 2015-05-14: 40 mg via INTRAVENOUS
  Administered 2015-05-14: 20 mg via INTRAVENOUS
  Administered 2015-05-14: 40 mg via INTRAVENOUS
  Administered 2015-05-14: 20 mg via INTRAVENOUS
  Administered 2015-05-14: 40 mg via INTRAVENOUS
  Administered 2015-05-14: 120 mg via INTRAVENOUS

## 2015-05-14 MED ORDER — OXYCODONE HCL 5 MG/5ML PO SOLN: 5.0000 mg | Freq: Once | ORAL | Status: DC | PRN

## 2015-05-14 MED ORDER — HYDROMORPHONE HCL 1 MG/ML IJ SOLN: 0.2500 mg | INTRAMUSCULAR | Status: DC | PRN

## 2015-05-14 MED ORDER — STERILE WATER FOR IRRIGATION IR SOLN
Status: DC | PRN
  Administered 2015-05-14: 10:00:00

## 2015-05-14 MED ORDER — OXYCODONE HCL 5 MG PO TABS: 5.0000 mg | ORAL_TABLET | Freq: Once | ORAL | Status: DC | PRN

## 2015-05-14 MED ORDER — MEPERIDINE HCL 25 MG/ML IJ SOLN: 6.2500 mg | INTRAMUSCULAR | Status: DC | PRN

## 2015-05-14 SURGICAL SUPPLY — 28 items

## 2015-05-14 NOTE — Transfer of Care (Signed)
Immediate Anesthesia Transfer of Care Note  Patient: Christine Ayala  Procedure(s) Performed: Procedure(s): COLONOSCOPY WITH PROPOFOL (N/A)  Patient Location: PACU  Anesthesia Type: MAC  Level of Consciousness: awake, alert  and patient cooperative  Airway and Oxygen Therapy: Patient Spontanous Breathing and Patient connected to supplemental oxygen  Post-op Assessment: Post-op Vital signs reviewed, Patient's Cardiovascular Status Stable, Respiratory Function Stable, Patent Airway and No signs of Nausea or vomiting  Post-op Vital Signs: Reviewed and stable  Complications: No apparent anesthesia complications

## 2015-05-14 NOTE — H&P (Signed)
  [facility]  [address] [place], [place] [address] Phone: [phone] Fax : [phone]  Primary Care Physician:  Keith Rake, MD Primary Gastroenterologist:  Dr. Allen Norris  Pre-Procedure History & Physical: HPI:  Christine Ayala is a 59 y.o. female is here for a screening colonoscopy.   Past Medical History  Diagnosis Date  . Panic attacks   . GERD (gastroesophageal reflux disease)   . Herpes genitalis   . Depression   . PONV (postoperative nausea and vomiting)   . Anemia   . Headache     migraines/sinus    Past Surgical History  Procedure Laterality Date  . Cesarean section  1978  . Foot surgery    . Wrist surgery      Prior to Admission medications   Medication Sig Start Date End Date Taking? Authorizing Provider  ALPRAZolam Duanne Moron) 0.5 MG tablet Take 1 tablet (0.5 mg total) by mouth 3 (three) times daily as needed for anxiety. 04/26/15  Yes Roselee Nova, MD  lansoprazole (PREVACID) 30 MG capsule Take by mouth. 08/21/14  Yes Historical Provider, MD  valACYclovir (VALTREX) 500 MG tablet Take by mouth. 07/20/14  Yes Historical Provider, MD  amoxicillin-clavulanate (AUGMENTIN) 875-125 MG tablet Take 1 tablet by mouth 2 (two) times daily. 04/26/15   Roselee Nova, MD  azithromycin (ZITHROMAX Z-PAK) 250 MG tablet 2 tabs po x day 1, then 1 tab po q day x 4 days Patient not taking: Reported on 04/26/2015 02/03/15   Roselee Nova, MD    Allergies as of 05/03/2015 - Review Complete 04/26/2015  Allergen Reaction Noted  . Tetracycline  11/10/2014    History reviewed. No pertinent family history.  Social History   Social History  . Marital Status: Married    Spouse Name: N/A  . Number of Children: N/A  . Years of Education: N/A   Occupational History  . Not on file.   Social History Main Topics  . Smoking status: Former Research scientist (life sciences)  . Smokeless tobacco: Never Used     Comment: quit 20 yrs ago  . Alcohol Use: No  . Drug Use: No  . Sexual  Activity: Not on file   Other Topics Concern  . Not on file   Social History Narrative    Review of Systems: See HPI, otherwise negative ROS  Physical Exam: BP 95/46 mmHg  Pulse 56  Temp(Src) 97.3 F (36.3 C) (Temporal)  Resp 16  Ht 5\' 4"  (1.626 m)  Wt 125 lb (56.7 kg)  BMI 21.45 kg/m2  SpO2 99% General:   Alert,  pleasant and cooperative in NAD Head:  Normocephalic and atraumatic. Neck:  Supple; no masses or thyromegaly. Lungs:  Clear throughout to auscultation.    Heart:  Regular rate and rhythm. Abdomen:  Soft, nontender and nondistended. Normal bowel sounds, without guarding, and without rebound.   Neurologic:  Alert and  oriented x4;  grossly normal neurologically.  Impression/Plan: Christine Ayala is now here to undergo a screening colonoscopy.  Risks, benefits, and alternatives regarding colonoscopy have been reviewed with the patient.  Questions have been answered.  All parties agreeable.

## 2015-05-14 NOTE — Discharge Instructions (Signed)

## 2015-05-14 NOTE — Anesthesia Procedure Notes (Signed)
Procedure Name: MAC Performed by: Fabion Gatson Pre-anesthesia Checklist: Patient identified, Emergency Drugs available, Suction available, Timeout performed and Patient being monitored Patient Re-evaluated:Patient Re-evaluated prior to inductionOxygen Delivery Method: Nasal cannula Placement Confirmation: positive ETCO2     

## 2015-05-14 NOTE — Anesthesia Postprocedure Evaluation (Signed)
Anesthesia Post Note  Patient: Christine Ayala  Procedure(s) Performed: Procedure(s) (LRB): COLONOSCOPY WITH PROPOFOL (N/A)  Patient location during evaluation: PACU Anesthesia Type: MAC Level of consciousness: awake and alert and oriented Pain management: pain level controlled Vital Signs Assessment: post-procedure vital signs reviewed and stable Respiratory status: spontaneous breathing Cardiovascular status: stable Postop Assessment: no signs of nausea or vomiting and adequate PO intake Anesthetic complications: no    Estill Batten

## 2015-05-14 NOTE — Anesthesia Preprocedure Evaluation (Signed)
Anesthesia Evaluation  Patient identified by MRN, date of birth, ID band  Reviewed: Allergy & Precautions, NPO status , Patient's Chart, lab work & pertinent test results, reviewed documented beta blocker date and time   History of Anesthesia Complications (+) PONV and history of anesthetic complications  Airway Mallampati: I  TM Distance: >3 FB Neck ROM: Full    Dental no notable dental hx.    Pulmonary former smoker,    Pulmonary exam normal        Cardiovascular negative cardio ROS Normal cardiovascular exam     Neuro/Psych  Headaches, PSYCHIATRIC DISORDERS Anxiety Depression    GI/Hepatic Neg liver ROS, GERD  Controlled and Medicated,  Endo/Other    Renal/GU      Musculoskeletal negative musculoskeletal ROS (+)   Abdominal   Peds negative pediatric ROS (+)  Hematology  (+) anemia ,   Anesthesia Other Findings   Reproductive/Obstetrics negative OB ROS                             Anesthesia Physical Anesthesia Plan  ASA: II  Anesthesia Plan: MAC   Post-op Pain Management:    Induction: Intravenous  Airway Management Planned:   Additional Equipment:   Intra-op Plan:   Post-operative Plan:   Informed Consent: I have reviewed the patients History and Physical, chart, labs and discussed the procedure including the risks, benefits and alternatives for the proposed anesthesia with the patient or authorized representative who has indicated his/her understanding and acceptance.     Plan Discussed with: CRNA  Anesthesia Plan Comments:         Anesthesia Quick Evaluation

## 2015-05-14 NOTE — Op Note (Signed)
[facility] Gastroenterology Patient Name: Christine Ayala Procedure Date: 05/14/2015 9:36 AM MRN: [id number] Account #: [id number] Date of Birth: 21-Dec-1955 Admit Type: Outpatient Age: 59 Room: [facility] OR ROOM 01 Gender: Female Note Status: Finalized Procedure:         Colonoscopy Indications:       Screening for colorectal malignant neoplasm Providers:         Lucilla Lame, MD Referring MD:      Otila Back. Manuella Ghazi (Referring MD) Medicines:         Propofol per Anesthesia Complications:     No immediate complications. Procedure:         Pre-Anesthesia Assessment:                    - Prior to the procedure, a History and Physical was                     performed, and patient medications and allergies were                     reviewed. The patient's tolerance of previous anesthesia                     was also reviewed. The risks and benefits of the procedure                     and the sedation options and risks were discussed with the                     patient. All questions were answered, and informed consent                     was obtained. Prior Anticoagulants: The patient has taken                     no previous anticoagulant or antiplatelet agents. ASA                     Grade Assessment: II - A patient with mild systemic                     disease. After reviewing the risks and benefits, the                     patient was deemed in satisfactory condition to undergo                     the procedure.                    After obtaining informed consent, the colonoscope was                     passed under direct vision. Throughout the procedure, the                     patient's blood pressure, pulse, and oxygen saturations                     were monitored continuously. The Olympus CF-HQ190L                     Colonoscope (S#. [id number]) was introduced through the anus  and advanced to the the cecum, identified by appendiceal      orifice and ileocecal valve. The colonoscopy was performed                     without difficulty. The patient tolerated the procedure                     well. The quality of the bowel preparation was excellent. Findings:      The perianal and digital rectal examinations were normal.      Non-bleeding internal hemorrhoids were found during retroflexion. The       hemorrhoids were Grade II (internal hemorrhoids that prolapse but reduce       spontaneously). Impression:        - Non-bleeding internal hemorrhoids.                    - No specimens collected. Recommendation:    - Repeat colonoscopy in 10 years for screening unless any                     change in family history or lower GI problems. Procedure Code(s): --- Professional ---                    [phone], Colonoscopy, flexible; diagnostic, including                     collection of specimen(s) by brushing or washing, when                     performed (separate procedure) Diagnosis Code(s): --- Professional ---                    Z12.11, Encounter for screening for malignant neoplasm of                     colon CPT copyright 2014 American Medical Association. All rights reserved. The codes documented in this report are preliminary and upon coder review may  be revised to meet current compliance requirements. Lucilla Lame, MD 05/14/2015 10:02:32 AM This report has been signed electronically. Number of Addenda: 0 Note Initiated On: 05/14/2015 9:36 AM Scope Withdrawal Time: 0 hours 8 minutes 57 seconds  Total Procedure Duration: 0 hours 15 minutes 0 seconds       [facility]

## 2015-07-27 ENCOUNTER — Encounter: Payer: Self-pay | Admitting: Family Medicine

## 2015-07-27 ENCOUNTER — Ambulatory Visit (INDEPENDENT_AMBULATORY_CARE_PROVIDER_SITE_OTHER): Payer: 59 | Admitting: Family Medicine

## 2015-07-27 VITALS — BP 101/70 | HR 99 | Temp 98.9°F | Resp 17 | Ht 64.0 in | Wt 128.7 lb

## 2015-07-27 DIAGNOSIS — Z8619 Personal history of other infectious and parasitic diseases: Secondary | ICD-10-CM | POA: Insufficient documentation

## 2015-07-27 DIAGNOSIS — K219 Gastro-esophageal reflux disease without esophagitis: Secondary | ICD-10-CM | POA: Diagnosis not present

## 2015-07-27 DIAGNOSIS — F410 Panic disorder [episodic paroxysmal anxiety] without agoraphobia: Secondary | ICD-10-CM

## 2015-07-27 DIAGNOSIS — A6000 Herpesviral infection of urogenital system, unspecified: Secondary | ICD-10-CM

## 2015-07-27 MED ORDER — LANSOPRAZOLE 30 MG PO CPDR: 30.0000 mg | DELAYED_RELEASE_CAPSULE | Freq: Every day | ORAL | Status: AC

## 2015-07-27 MED ORDER — ALPRAZOLAM 0.5 MG PO TABS: 0.5000 mg | ORAL_TABLET | Freq: Three times a day (TID) | ORAL | Status: DC | PRN

## 2015-07-27 MED ORDER — VALACYCLOVIR HCL 500 MG PO TABS: 500.0000 mg | ORAL_TABLET | Freq: Every day | ORAL | Status: DC

## 2015-07-27 NOTE — Progress Notes (Signed)
Name: Christine Ayala   MRN: [id number]    DOB: [birth year]   Date:07/27/2015       Progress Note  Subjective  Chief Complaint  Chief Complaint  Patient presents with  . Follow-up    3 mo    HPI  Pt is here for follow up and medication refills. Anxiety: Symptoms include panic attacks (heart racing, shortness of breath, irritability). Takes Alprazolam 0.5 mg three times daily as needed, with good symptom relief. Reflux: Symptoms include heartburn, reflux, worse with certain foods (spicy foods). Relieved with Lansoprazole taken daily. History of Herpes: Has history of Herpes Genitalis, no recent outbreaks, takes Valtrex 500 mg every other day.   Past Medical History  Diagnosis Date  . Panic attacks   . GERD (gastroesophageal reflux disease)   . Herpes genitalis   . Depression   . PONV (postoperative nausea and vomiting)   . Anemia   . Headache     migraines/sinus    Past Surgical History  Procedure Laterality Date  . Cesarean section  1978  . Foot surgery    . Wrist surgery    . Colonoscopy with propofol N/A 05/14/2015    Procedure: COLONOSCOPY WITH PROPOFOL;  Surgeon: Lucilla Lame, MD;  Location: [place];  Service: Endoscopy;  Laterality: N/A;    History reviewed. No pertinent family history.  Social History   Social History  . Marital Status: Married    Spouse Name: N/A  . Number of Children: N/A  . Years of Education: N/A   Occupational History  . Not on file.   Social History Main Topics  . Smoking status: Former Research scientist (life sciences)  . Smokeless tobacco: Never Used     Comment: quit 20 yrs ago  . Alcohol Use: No  . Drug Use: No  . Sexual Activity: Not on file   Other Topics Concern  . Not on file   Social History Narrative     Current outpatient prescriptions:  .  ALPRAZolam (XANAX) 0.5 MG tablet, Take 1 tablet (0.5 mg total) by mouth 3 (three) times daily as needed for anxiety., Disp: 90 tablet, Rfl: 0 .  lansoprazole (PREVACID) 30 MG capsule, Take by  mouth., Disp: , Rfl:  .  valACYclovir (VALTREX) 500 MG tablet, Take by mouth., Disp: , Rfl:   Allergies  Allergen Reactions  . Tetracycline Nausea And Vomiting     Review of Systems  Constitutional: Negative for fever and chills.  Gastrointestinal: Negative for heartburn, nausea and vomiting.  Skin: Negative for itching and rash.  Psychiatric/Behavioral: Negative for depression. The patient has insomnia. The patient is not nervous/anxious.      Objective  Filed Vitals:   07/27/15 0840  BP: 101/70  Pulse: 99  Temp: 98.9 F (37.2 C)  TempSrc: Oral  Resp: 17  Height: 5\' 4"  (1.626 m)  Weight: 128 lb 11.2 oz (58.378 kg)  SpO2: 96%    Physical Exam  Constitutional: She is oriented to person, place, and time and well-developed, well-nourished, and in no distress.  Cardiovascular: Normal rate and regular rhythm.   Pulmonary/Chest: Effort normal and breath sounds normal.  Abdominal: Soft. Bowel sounds are normal.  Neurological: She is alert and oriented to person, place, and time.  Psychiatric: Mood, memory, affect and judgment normal.  Nursing note and vitals reviewed.     Assessment & Plan  1. Gastroesophageal reflux disease, esophagitis presence not specified  - lansoprazole (PREVACID) 30 MG capsule; Take 1 capsule (30 mg total) by mouth daily.  Dispense: 90 capsule; Refill: 0  2. Herpes genitalis  - valACYclovir (VALTREX) 500 MG tablet; Take 1 tablet (500 mg total) by mouth daily.  Dispense: 90 tablet; Refill: 0  3. Panic disorder without agoraphobia  - ALPRAZolam (XANAX) 0.5 MG tablet; Take 1 tablet (0.5 mg total) by mouth 3 (three) times daily as needed for anxiety.  Dispense: 90 tablet; Refill: 2   [name] Asad A[facility] 07/27/2015 8:52 AM

## 2015-08-31 ENCOUNTER — Encounter: Payer: Self-pay | Admitting: Family Medicine

## 2015-08-31 ENCOUNTER — Ambulatory Visit (INDEPENDENT_AMBULATORY_CARE_PROVIDER_SITE_OTHER): Payer: 59 | Admitting: Family Medicine

## 2015-08-31 VITALS — BP 110/68 | HR 93 | Temp 98.2°F | Resp 16 | Ht 64.0 in | Wt 125.6 lb

## 2015-08-31 DIAGNOSIS — J069 Acute upper respiratory infection, unspecified: Secondary | ICD-10-CM

## 2015-08-31 DIAGNOSIS — R053 Chronic cough: Secondary | ICD-10-CM | POA: Insufficient documentation

## 2015-08-31 DIAGNOSIS — R05 Cough: Secondary | ICD-10-CM | POA: Insufficient documentation

## 2015-08-31 DIAGNOSIS — B9789 Other viral agents as the cause of diseases classified elsewhere: Principal | ICD-10-CM

## 2015-08-31 MED ORDER — BENZONATATE 200 MG PO CAPS: 200.0000 mg | ORAL_CAPSULE | Freq: Three times a day (TID) | ORAL | Status: DC | PRN

## 2015-08-31 NOTE — Progress Notes (Signed)
Name: Christine Ayala   MRN: [id number]    DOB: 02/24/1956   Date:08/31/2015       Progress Note  Subjective  Chief Complaint  Chief Complaint  Patient presents with  . Cough    Cough This is a new problem. Episode onset: 3 days ago. The cough is non-productive. Associated symptoms include a fever, headaches, a sore throat and shortness of breath. Pertinent negatives include no chills.     Past Medical History  Diagnosis Date  . GERD (gastroesophageal reflux disease)   . Herpes genitalis   . Depression   . PONV (postoperative nausea and vomiting)   . Anemia   . Headache     migraines/sinus    Past Surgical History  Procedure Laterality Date  . Cesarean section  1978  . Foot surgery    . Wrist surgery    . Colonoscopy with propofol N/A 05/14/2015    Procedure: COLONOSCOPY WITH PROPOFOL;  Surgeon: Lucilla Lame, MD;  Location: [place];  Service: Endoscopy;  Laterality: N/A;    History reviewed. No pertinent family history.  Social History   Social History  . Marital Status: Married    Spouse Name: N/A  . Number of Children: N/A  . Years of Education: N/A   Occupational History  . Not on file.   Social History Main Topics  . Smoking status: Former Research scientist (life sciences)  . Smokeless tobacco: Never Used     Comment: quit 20 yrs ago  . Alcohol Use: No  . Drug Use: No  . Sexual Activity: Not on file   Other Topics Concern  . Not on file   Social History Narrative     Current outpatient prescriptions:  .  ALPRAZolam (XANAX) 0.5 MG tablet, Take 1 tablet (0.5 mg total) by mouth 3 (three) times daily as needed for anxiety., Disp: 90 tablet, Rfl: 2 .  lansoprazole (PREVACID) 30 MG capsule, Take 1 capsule (30 mg total) by mouth daily., Disp: 90 capsule, Rfl: 0 .  valACYclovir (VALTREX) 500 MG tablet, Take 1 tablet (500 mg total) by mouth daily., Disp: 90 tablet, Rfl: 0  Allergies  Allergen Reactions  . Tetracycline Nausea And Vomiting     Review of Systems   Constitutional: Positive for fever. Negative for chills.  HENT: Positive for sore throat.   Respiratory: Positive for cough and shortness of breath.   Neurological: Positive for headaches.    Objective  Filed Vitals:   08/31/15 1153  BP: 110/68  Pulse: 93  Temp: 98.2 F (36.8 C)  TempSrc: Oral  Resp: 16  Height: 5\' 4"  (1.626 m)  Weight: 125 lb 9.6 oz (56.972 kg)  SpO2: 97%    Physical Exam  Constitutional: She is oriented to person, place, and time and well-developed, well-nourished, and in no distress.  HENT:  Head: Normocephalic and atraumatic.  Nose: Right sinus exhibits no maxillary sinus tenderness. Left sinus exhibits no maxillary sinus tenderness.  Mouth/Throat: No posterior oropharyngeal erythema.  Cardiovascular: Normal rate and regular rhythm.   Pulmonary/Chest: Effort normal and breath sounds normal.  Neurological: She is alert and oriented to person, place, and time.  Nursing note and vitals reviewed.    Assessment & Plan  1. Viral URI with cough Likely viral upper respiratory infection, will start on antitussive therapy. If no improvement, may need antibiotics. - benzonatate (TESSALON) 200 MG capsule; Take 1 capsule (200 mg total) by mouth 3 (three) times daily as needed for cough.  Dispense: 20 capsule; Refill: 0  Daoud Lobue Asad [facility] 08/31/2015 12:01 PM

## 2015-09-06 ENCOUNTER — Telehealth: Payer: Self-pay | Admitting: Family Medicine

## 2015-09-06 DIAGNOSIS — R05 Cough: Secondary | ICD-10-CM

## 2015-09-06 DIAGNOSIS — R053 Chronic cough: Secondary | ICD-10-CM

## 2015-09-06 NOTE — Telephone Encounter (Signed)
PT SAID THAT YOU TOLD HER TO CALL IF SHE WAS NO BETTER THAT YOU WOULD CALL HER SOMETHING IN [place]. PHARM IS [facility] IN [facility]

## 2015-09-07 ENCOUNTER — Encounter: Payer: 59 | Admitting: Family Medicine

## 2015-09-07 MED ORDER — AZITHROMYCIN 250 MG PO TABS: ORAL_TABLET | ORAL | Status: DC

## 2015-09-07 NOTE — Telephone Encounter (Signed)
PT NOTIFIED ABOUT RX

## 2015-09-07 NOTE — Telephone Encounter (Signed)
He shouldn't has persistent dry cough, mainly at night, antitussive therapy has not helped and is requesting an antibiotic. We will start on Z-Pak and prescription has been sent to pharmacy

## 2015-10-26 ENCOUNTER — Ambulatory Visit (INDEPENDENT_AMBULATORY_CARE_PROVIDER_SITE_OTHER): Payer: 59 | Admitting: Family Medicine

## 2015-10-26 ENCOUNTER — Encounter: Payer: Self-pay | Admitting: Family Medicine

## 2015-10-26 VITALS — BP 110/62 | HR 93 | Temp 98.7°F | Resp 16 | Ht 64.0 in | Wt 126.7 lb

## 2015-10-26 DIAGNOSIS — Z0000 Encounter for general adult medical examination without abnormal findings: Secondary | ICD-10-CM | POA: Diagnosis not present

## 2015-10-26 NOTE — Progress Notes (Signed)
Name: Christine Ayala   MRN: [id number]    DOB: 04/22/1956   Date:10/26/2015       Progress Note  Subjective  Chief Complaint  Chief Complaint  Patient presents with  . Annual Exam    CPE    HPI  Pt. Presents for Annual Physical Exam.  She goes to [facility] for gynecological screenings and is due for a mammogram and Pap Smear. Last colonoscopy was December 2016, was normal.   Past Medical History  Diagnosis Date  . GERD (gastroesophageal reflux disease)   . Herpes genitalis   . Depression   . PONV (postoperative nausea and vomiting)   . Anemia   . Headache     migraines/sinus    Past Surgical History  Procedure Laterality Date  . Cesarean section  1978  . Foot surgery    . Wrist surgery    . Colonoscopy with propofol N/A 05/14/2015    Procedure: COLONOSCOPY WITH PROPOFOL;  Surgeon: Lucilla Lame, MD;  Location: [place];  Service: Endoscopy;  Laterality: N/A;    History reviewed. No pertinent family history.  Social History   Social History  . Marital Status: Married    Spouse Name: N/A  . Number of Children: N/A  . Years of Education: N/A   Occupational History  . Not on file.   Social History Main Topics  . Smoking status: Former Research scientist (life sciences)  . Smokeless tobacco: Never Used     Comment: quit 20 yrs ago  . Alcohol Use: No  . Drug Use: No  . Sexual Activity: Not on file   Other Topics Concern  . Not on file   Social History Narrative     Current outpatient prescriptions:  .  ALPRAZolam (XANAX) 0.5 MG tablet, Take 1 tablet (0.5 mg total) by mouth 3 (three) times daily as needed for anxiety., Disp: 90 tablet, Rfl: 2 .  lansoprazole (PREVACID) 30 MG capsule, Take 1 capsule (30 mg total) by mouth daily., Disp: 90 capsule, Rfl: 0 .  valACYclovir (VALTREX) 500 MG tablet, Take 1 tablet (500 mg total) by mouth daily., Disp: 90 tablet, Rfl: 0 .  azithromycin (ZITHROMAX) 250 MG tablet, 2 tabs po day 1, then 1 tab po q day x 4 days (Patient not  taking: Reported on 10/26/2015), Disp: 6 tablet, Rfl: 0 .  benzonatate (TESSALON) 200 MG capsule, Take 1 capsule (200 mg total) by mouth 3 (three) times daily as needed for cough. (Patient not taking: Reported on 10/26/2015), Disp: 20 capsule, Rfl: 0  Allergies  Allergen Reactions  . Tetracycline Nausea And Vomiting    Review of Systems  Constitutional: Negative for fever, chills and malaise/fatigue.  Eyes: Positive for blurred vision (blurred vision, floaters, etc. has been seen by Ophthalmolgoy.) and double vision.  Respiratory: Positive for shortness of breath (with exertion.). Negative for cough, sputum production and wheezing.   Cardiovascular: Positive for chest pain (intermittent chest pains. Hx of trauma to the breast.). Negative for palpitations.  Gastrointestinal: Positive for heartburn (frequent heartburn). Negative for nausea, vomiting, abdominal pain, diarrhea, constipation and blood in stool.  Genitourinary: Negative for dysuria and hematuria.  Musculoskeletal: Positive for back pain. Negative for myalgias.  Skin: Negative for rash.  Neurological: Positive for dizziness (intermittent dizzy spells.) and headaches (migriane headaches, and tension headaches.).  Psychiatric/Behavioral: Negative for depression. The patient is nervous/anxious.     Objective  Filed Vitals:   10/26/15 1552  BP: 110/62  Pulse: 93  Temp: 98.7 F (37.1 C)  TempSrc: Oral  Resp: 16  Height: 5\' 4"  (1.626 m)  Weight: 126 lb 11.2 oz (57.471 kg)  SpO2: 97%    Physical Exam  Constitutional: She is oriented to person, place, and time and well-developed, well-nourished, and in no distress.  HENT:  Head: Normocephalic and atraumatic.  Mouth/Throat: No oropharyngeal exudate or posterior oropharyngeal erythema.  Eyes: Pupils are equal, round, and reactive to light.  Cardiovascular: Normal rate and regular rhythm.   Murmur heard. Pulmonary/Chest: Effort normal and breath sounds normal. She has no  wheezes.  Abdominal: Soft. Bowel sounds are normal. There is no tenderness.  Neurological: She is alert and oriented to person, place, and time.  Psychiatric: Mood, memory, affect and judgment normal.  Nursing note and vitals reviewed.       Assessment & Plan  1. Well woman exam (no gynecological exam) Obtain age-appropriate laboratory screenings. She will follow up with OB/GYN for gynecological exam - CBC with Differential - Comprehensive Metabolic Panel (CMET) - Lipid Profile - Vitamin D (25 hydroxy) - TSH   Christine Ayala A. [facility] 10/26/2015 4:03 PM

## 2015-10-28 ENCOUNTER — Ambulatory Visit: Payer: 59 | Admitting: Family Medicine

## 2015-12-03 LAB — CBC WITH DIFFERENTIAL/PLATELET
Basophils Absolute: 0.1 10*3/uL (ref 0.0–0.2)
Basos: 1 %
EOS (ABSOLUTE): 0.1 10*3/uL (ref 0.0–0.4)
Eos: 3 %
Hematocrit: 37.2 % (ref 34.0–46.6)
Hemoglobin: 13 g/dL (ref 11.1–15.9)
Immature Grans (Abs): 0 10*3/uL (ref 0.0–0.1)
Immature Granulocytes: 0 %
Lymphocytes Absolute: 1.8 10*3/uL (ref 0.7–3.1)
Lymphs: 35 %
MCH: 29.7 pg (ref 26.6–33.0)
MCHC: 34.9 g/dL (ref 31.5–35.7)
MCV: 85 fL (ref 79–97)
Monocytes Absolute: 0.4 10*3/uL (ref 0.1–0.9)
Monocytes: 7 %
Neutrophils Absolute: 2.8 10*3/uL (ref 1.4–7.0)
Neutrophils: 54 %
Platelets: 251 10*3/uL (ref 150–379)
RBC: 4.38 x10E6/uL (ref 3.77–5.28)
RDW: 13.3 % (ref 12.3–15.4)
WBC: 5.1 10*3/uL (ref 3.4–10.8)

## 2015-12-03 LAB — COMPREHENSIVE METABOLIC PANEL
ALT: 19 IU/L (ref 0–32)
AST: 20 IU/L (ref 0–40)
Albumin/Globulin Ratio: 2 (ref 1.2–2.2)
Albumin: 4.3 g/dL (ref 3.5–5.5)
Alkaline Phosphatase: 65 IU/L (ref 39–117)
BUN/Creatinine Ratio: 19 (ref 9–23)
BUN: 14 mg/dL (ref 6–24)
Bilirubin Total: 0.3 mg/dL (ref 0.0–1.2)
CO2: 24 mmol/L (ref 18–29)
Calcium: 9.4 mg/dL (ref 8.7–10.2)
Chloride: 104 mmol/L (ref 96–106)
Creatinine, Ser: 0.75 mg/dL (ref 0.57–1.00)
GFR calc Af Amer: 102 mL/min/{1.73_m2} (ref >59)
GFR calc non Af Amer: 89 mL/min/{1.73_m2} (ref >59)
Globulin, Total: 2.1 g/dL (ref 1.5–4.5)
Glucose: 90 mg/dL (ref 65–99)
Potassium: 4.4 mmol/L (ref 3.5–5.2)
Sodium: 143 mmol/L (ref 134–144)
Total Protein: 6.4 g/dL (ref 6.0–8.5)

## 2015-12-03 LAB — LIPID PANEL
Chol/HDL Ratio: 3.4 ratio units (ref 0.0–4.4)
Cholesterol, Total: 195 mg/dL (ref 100–199)
HDL: 58 mg/dL (ref >39)
LDL Calculated: 126 mg/dL — ABNORMAL HIGH (ref 0–99)
Triglycerides: 53 mg/dL (ref 0–149)
VLDL Cholesterol Cal: 11 mg/dL (ref 5–40)

## 2015-12-03 LAB — VITAMIN D 25 HYDROXY (VIT D DEFICIENCY, FRACTURES): Vit D, 25-Hydroxy: 37.5 ng/mL (ref 30.0–100.0)

## 2015-12-03 LAB — TSH: TSH: 3.47 u[IU]/mL (ref 0.450–4.500)

## 2015-12-07 ENCOUNTER — Telehealth: Payer: Self-pay | Admitting: Family Medicine

## 2015-12-08 NOTE — Progress Notes (Signed)
Patient notified of lab results

## 2015-12-28 NOTE — Telephone Encounter (Signed)
COMPLETED

## 2016-02-01 IMAGING — MR MR HEAD W/O CM
10 series · 48 of 48 positions shown · non-contrast
Comparison: None.

CLINICAL DATA: 58-year-old female with short-term memory loss and
daily headaches. Pain radiating to the top of the head progressing
in frequency and length. No known injury. Initial encounter.

EXAM:
MRI HEAD WITHOUT CONTRAST
TECHNIQUE: Multiplanar, multiecho pulse sequences of the brain and surrounding
structures were obtained without intravenous contrast.

[Series 2: T1 · sagittal · 5.0mm · 0.45mm/px · 2 of 23 slices shown (1 of 2)]
[im 1/23]
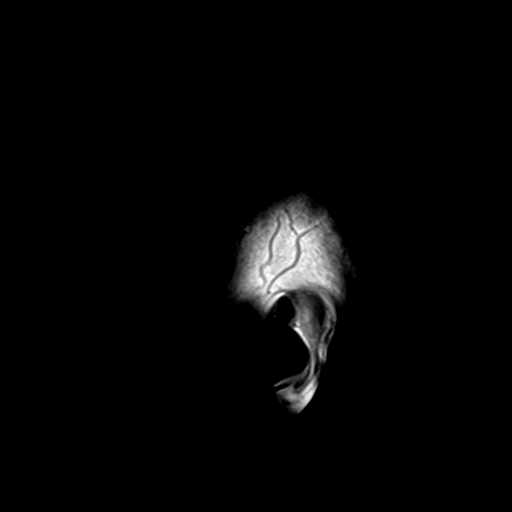
[im 23/23]
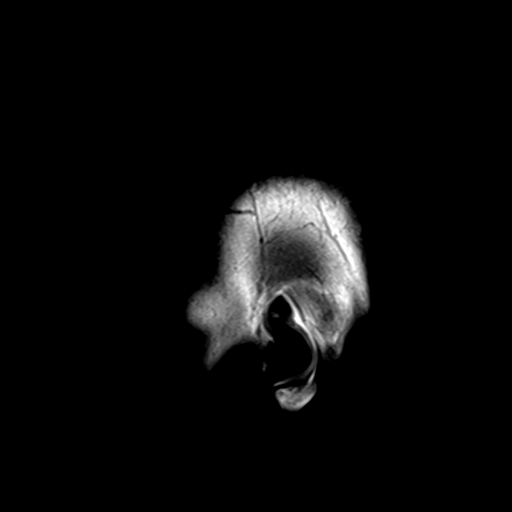

[Series 4: DWI · axial · 3.0mm · 1.20mm/px · z∈[-67,+92]mm · 7 of 55 slices shown (1 of 4)]
[im 1/55]
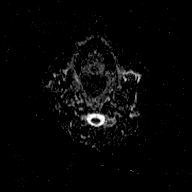
[im 10/55]
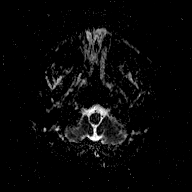
[im 19/55]
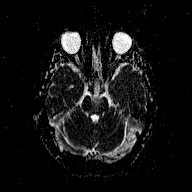
[im 28/55]
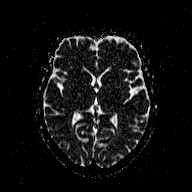
[im 37/55]
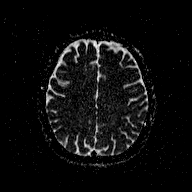
[im 46/55]
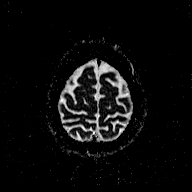
[im 55/55]
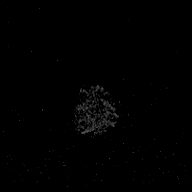

[Series 6: DWI · coronal · 3.0mm · 1.20mm/px · 6 of 46 slices shown (2 of 4)]
[im 1/46]
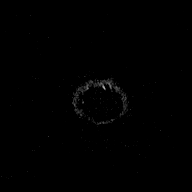
[im 10/46]
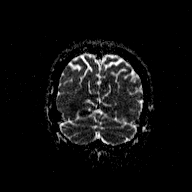
[im 19/46]
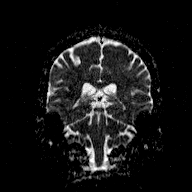
[im 28/46]
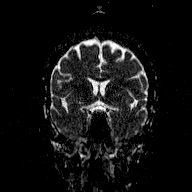
[im 37/46]
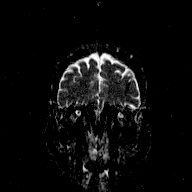
[im 46/46]
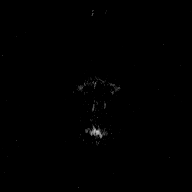

[Series 7: T2 · axial · 5.0mm · 0.72mm/px · z∈[-71,+96]mm · 3 of 27 slices shown (1 of 3)]
[im 1/27]
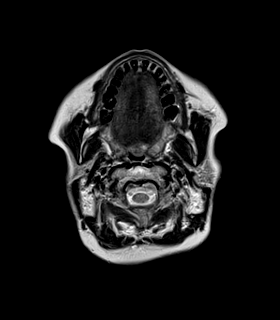
[im 14/27]
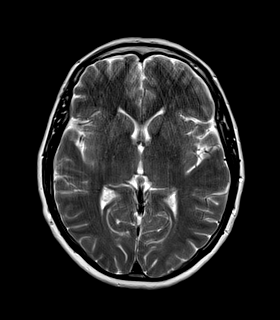
[im 27/27]
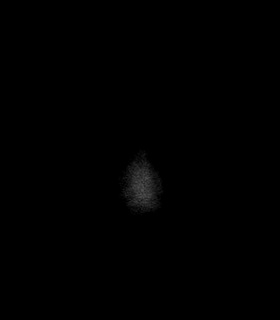

[Series 8: FLAIR · axial · 5.0mm · 0.45mm/px · z∈[-71,+96]mm · 3 of 27 slices shown]
[im 1/27]
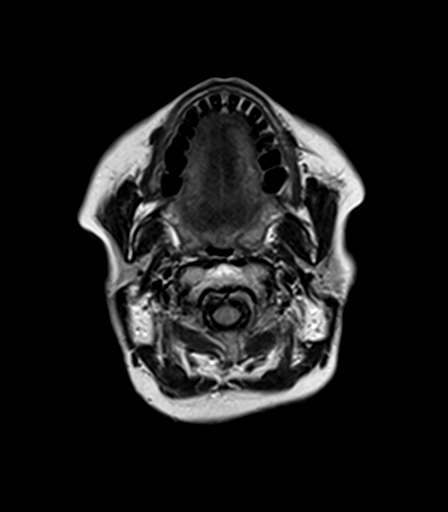
[im 14/27]
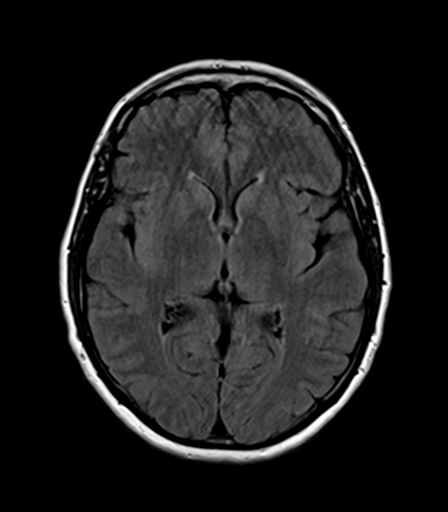
[im 27/27]
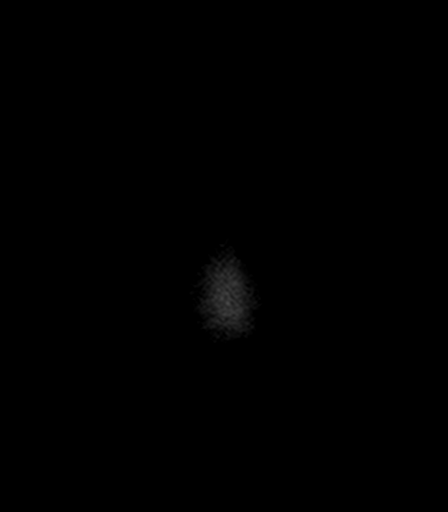

[Series 9: T2 · axial · 5.0mm · 0.72mm/px · z∈[-71,+96]mm · 3 of 27 slices shown (2 of 3)]
[im 1/27]
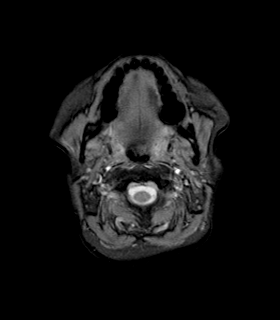
[im 14/27]
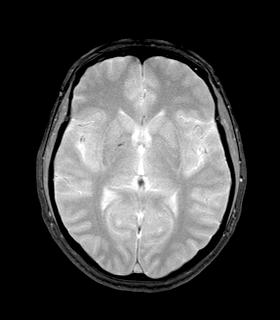
[im 27/27]
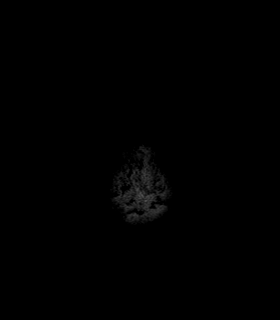

[Series 10: T1 · axial · 3.0mm · 1.00mm/px · z∈[-67,+96]mm · 7 of 56 slices shown (2 of 2)]
[im 1/56]
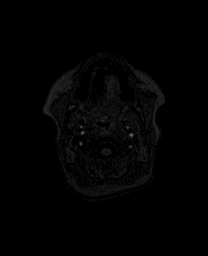
[im 10/56]
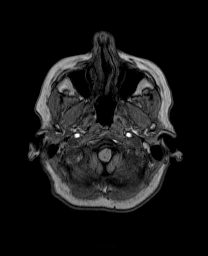
[im 19/56]
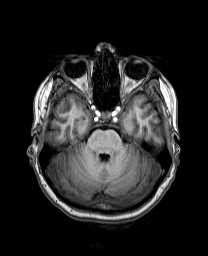
[im 28/56]
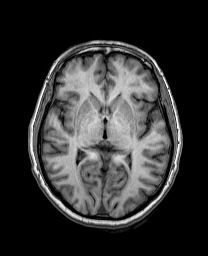
[im 37/56]
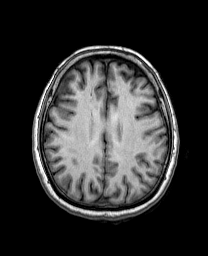
[im 46/56]
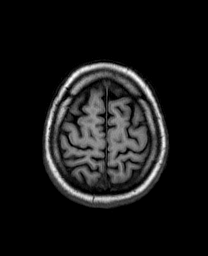
[im 56/56]
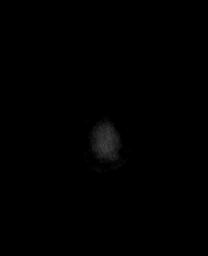

[Series 11: T2 · coronal · 5.0mm · 0.45mm/px · 4 of 29 slices shown (3 of 3)]
[im 1/29]
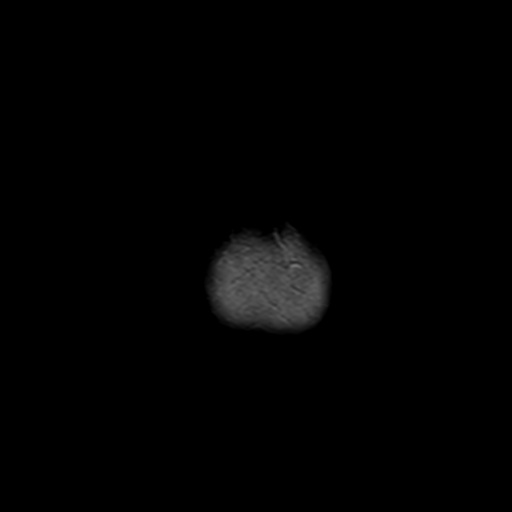
[im 10/29]
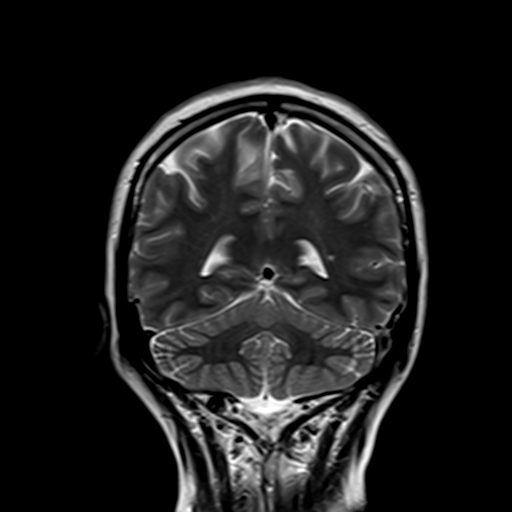
[im 19/29]
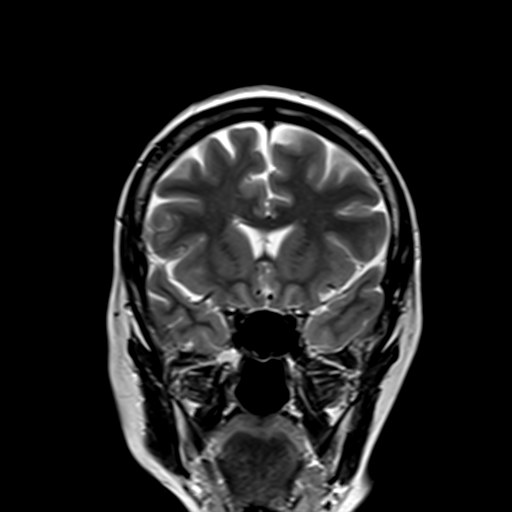
[im 29/29]
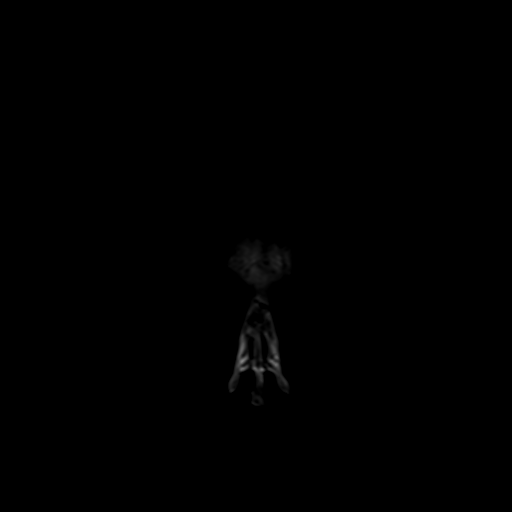

[Series 100: DWI · axial · 3.0mm · 1.20mm/px · z∈[-67,+92]mm · 7 of 55 slices shown (3 of 4)]
[im 1/55]
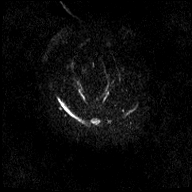
[im 10/55]
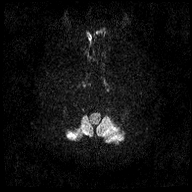
[im 19/55]
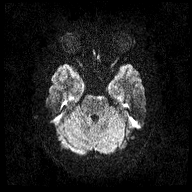
[im 28/55]
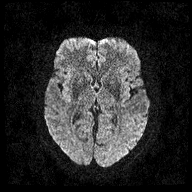
[im 37/55]
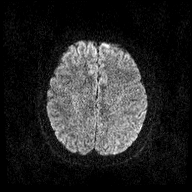
[im 46/55]
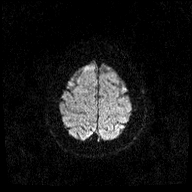
[im 55/55]
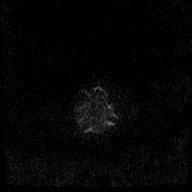

[Series 101: DWI · coronal · 3.0mm · 1.20mm/px · 6 of 45 slices shown (4 of 4)]
[im 1/45]
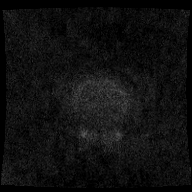
[im 9/45]
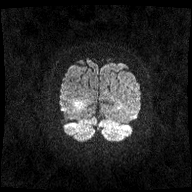
[im 18/45]
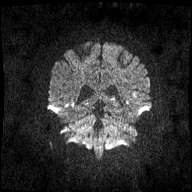
[im 27/45]
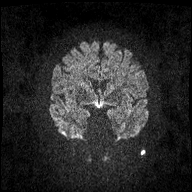
[im 36/45]
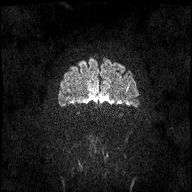
[im 45/45]
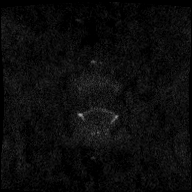

[48 of 48 positions shown; findings below may reference images not displayed]

FINDINGS: Cerebral volume is normal. No restricted diffusion to suggest acute
infarction. No midline shift, mass effect, evidence of mass lesion,
ventriculomegaly, extra-axial collection or acute intracranial
hemorrhage. Cervicomedullary junction and pituitary are within
normal limits. Major intracranial vascular flow voids are within
normal limits. Gray and white matter signal is within normal limits
for age throughout the brain. No cortical encephalomalacia or
chronic cerebral blood products identified. Mesial temporal lobe
structures appear within normal limits.

Visible internal auditory structures appear normal. Visualized
paranasal sinuses and mastoids are clear. Orbits soft tissues appear
normal. Visualized scalp soft tissues are within normal limits.
Normal bone marrow signal. Negative visualized cervical spine.
IMPRESSION: Normal for age noncontrast MRI appearance of the brain.

## 2016-03-21 ENCOUNTER — Ambulatory Visit (INDEPENDENT_AMBULATORY_CARE_PROVIDER_SITE_OTHER): Payer: 59 | Admitting: Family Medicine

## 2016-03-21 ENCOUNTER — Encounter: Payer: Self-pay | Admitting: Family Medicine

## 2016-03-21 VITALS — BP 112/66 | HR 80 | Temp 98.0°F | Resp 17 | Ht 64.0 in | Wt 127.4 lb

## 2016-03-21 DIAGNOSIS — Z8619 Personal history of other infectious and parasitic diseases: Secondary | ICD-10-CM | POA: Diagnosis not present

## 2016-03-21 DIAGNOSIS — F410 Panic disorder [episodic paroxysmal anxiety] without agoraphobia: Secondary | ICD-10-CM | POA: Diagnosis not present

## 2016-03-21 MED ORDER — ALPRAZOLAM 0.5 MG PO TABS: 0.5000 mg | ORAL_TABLET | Freq: Three times a day (TID) | ORAL | 2 refills | Status: DC | PRN

## 2016-03-21 MED ORDER — VALACYCLOVIR HCL 500 MG PO TABS: 500.0000 mg | ORAL_TABLET | Freq: Every day | ORAL | 1 refills | Status: DC

## 2016-03-21 NOTE — Progress Notes (Signed)
Name: Christine Ayala   MRN: [id number]    DOB: [birth year]   Date:03/21/2016       Progress Note  Subjective  Chief Complaint  Chief Complaint  Patient presents with  . Medication Refill    Anxiety  Presents for follow-up visit. Symptoms include insomnia, nervous/anxious behavior and panic. Patient reports no chest pain, depressed mood, excessive worry, irritability, muscle tension, shortness of breath or suicidal ideas. The quality of sleep is poor.    History of Genital Herpes: Pt. Has hsitory of genital herpes, thinks she may have gotten it from her husband. She has no active outbreak at this time. She takes Valtrex 500 mg daily for suppression.    Past Medical History:  Diagnosis Date  . Anemia   . Depression   . GERD (gastroesophageal reflux disease)   . Headache    migraines/sinus  . Herpes genitalis   . PONV (postoperative nausea and vomiting)     Past Surgical History:  Procedure Laterality Date  . [place]  . COLONOSCOPY WITH PROPOFOL N/A 05/14/2015   Procedure: COLONOSCOPY WITH PROPOFOL;  Surgeon: Lucilla Lame, MD;  Location: [place];  Service: Endoscopy;  Laterality: N/A;  . FOOT SURGERY    . WRIST SURGERY      History reviewed. No pertinent family history.  Social History   Social History  . Marital status: Married    Spouse name: N/A  . Number of children: N/A  . Years of education: N/A   Occupational History  . Not on file.   Social History Main Topics  . Smoking status: Former Research scientist (life sciences)  . Smokeless tobacco: Never Used     Comment: quit 20 yrs ago  . Alcohol use No  . Drug use: No  . Sexual activity: Not on file   Other Topics Concern  . Not on file   Social History Narrative  . No narrative on file     Current Outpatient Prescriptions:  .  ALPRAZolam (XANAX) 0.5 MG tablet, Take 1 tablet (0.5 mg total) by mouth 3 (three) times daily as needed for anxiety., Disp: 90 tablet, Rfl: 2 .  lansoprazole (PREVACID) 30 MG  capsule, Take 1 capsule (30 mg total) by mouth daily., Disp: 90 capsule, Rfl: 0 .  valACYclovir (VALTREX) 500 MG tablet, Take 1 tablet (500 mg total) by mouth daily., Disp: 90 tablet, Rfl: 0  Allergies  Allergen Reactions  . Tetracycline Nausea And Vomiting     Review of Systems  Constitutional: Negative for chills, fever, irritability and malaise/fatigue.  Respiratory: Negative for cough and shortness of breath.   Cardiovascular: Negative for chest pain.  Gastrointestinal: Negative for abdominal pain.  Genitourinary: Negative for dysuria, frequency and hematuria.  Skin: Negative for itching and rash.  Psychiatric/Behavioral: Negative for depression and suicidal ideas. The patient is nervous/anxious and has insomnia.       Objective  Vitals:   03/21/16 1546  BP: 112/66  Pulse: 80  Resp: 17  Temp: 98 F (36.7 C)  TempSrc: Oral  SpO2: 96%  Weight: 127 lb 6.4 oz (57.8 kg)  Height: 5\' 4"  (1.626 m)    Physical Exam  Constitutional: She is oriented to person, place, and time and well-developed, well-nourished, and in no distress.  HENT:  Head: Normocephalic and atraumatic.  Cardiovascular: Normal rate, regular rhythm, S1 normal, S2 normal and normal heart sounds.   No murmur heard. Pulmonary/Chest: Effort normal and breath sounds normal. She has no wheezes. She has no rhonchi.  Neurological: She is alert and oriented to person, place, and time.  Psychiatric: Mood, memory, affect and judgment normal.  Nursing note and vitals reviewed.      Assessment & Plan  1. History of herpes simplex infection No recurrence on suppression therapy. Refills provided - valACYclovir (VALTREX) 500 MG tablet; Take 1 tablet (500 mg total) by mouth daily.  Dispense: 90 tablet; Refill: 1  2. Panic disorder without agoraphobia Stable, responsive to alprazolam taken up to 3 times daily when needed. Refills provided - ALPRAZolam (XANAX) 0.5 MG tablet; Take 1 tablet (0.5 mg total) by mouth 3  (three) times daily as needed for anxiety.  Dispense: 90 tablet; Refill: 2   Christine Ayala Asad A. [facility] 03/21/2016 4:14 PM

## 2016-05-12 ENCOUNTER — Encounter: Payer: Self-pay | Admitting: Family Medicine

## 2016-05-12 ENCOUNTER — Ambulatory Visit (INDEPENDENT_AMBULATORY_CARE_PROVIDER_SITE_OTHER): Payer: 59 | Admitting: Family Medicine

## 2016-05-12 VITALS — BP 122/60 | HR 93 | Temp 98.5°F | Resp 16 | Ht 64.0 in | Wt 128.4 lb

## 2016-05-12 DIAGNOSIS — J029 Acute pharyngitis, unspecified: Secondary | ICD-10-CM | POA: Diagnosis not present

## 2016-05-12 DIAGNOSIS — R6889 Other general symptoms and signs: Secondary | ICD-10-CM | POA: Diagnosis not present

## 2016-05-12 LAB — POCT INFLUENZA A/B: Influenza A, POC: NEGATIVE

## 2016-05-12 LAB — POCT RAPID STREP A (OFFICE): Rapid Strep A Screen: NEGATIVE

## 2016-05-12 NOTE — Progress Notes (Signed)
Name: Christine Ayala   MRN: [id number]    DOB: 02/20/1956   Date:05/12/2016       Progress Note  Subjective  Chief Complaint  Chief Complaint  Patient presents with  . Sore Throat    fever, cough for 2 days    Sore Throat   This is a new problem. The current episode started yesterday. The problem has been unchanged. There has been no fever (elevated temperature to 99 F). The pain is moderate. Associated symptoms include coughing and headaches. Pertinent negatives include no ear discharge, neck pain, shortness of breath or trouble swallowing. She has had no exposure to strep. Treatments tried: nasal spray.     Past Medical History:  Diagnosis Date  . Anemia   . Depression   . GERD (gastroesophageal reflux disease)   . Headache    migraines/sinus  . Herpes genitalis   . PONV (postoperative nausea and vomiting)     Past Surgical History:  Procedure Laterality Date  . [place]  . COLONOSCOPY WITH PROPOFOL N/A 05/14/2015   Procedure: COLONOSCOPY WITH PROPOFOL;  Surgeon: Lucilla Lame, MD;  Location: [place];  Service: Endoscopy;  Laterality: N/A;  . FOOT SURGERY    . WRIST SURGERY      No family history on file.  Social History   Social History  . Marital status: Married    Spouse name: N/A  . Number of children: N/A  . Years of education: N/A   Occupational History  . Not on file.   Social History Main Topics  . Smoking status: Former Research scientist (life sciences)  . Smokeless tobacco: Never Used     Comment: quit 20 yrs ago  . Alcohol use No  . Drug use: No  . Sexual activity: Not on file   Other Topics Concern  . Not on file   Social History Narrative  . No narrative on file     Current Outpatient Prescriptions:  .  ALPRAZolam (XANAX) 0.5 MG tablet, Take 1 tablet (0.5 mg total) by mouth 3 (three) times daily as needed for anxiety., Disp: 90 tablet, Rfl: 2 .  lansoprazole (PREVACID) 30 MG capsule, Take 1 capsule (30 mg total) by mouth daily., Disp: 90  capsule, Rfl: 0 .  valACYclovir (VALTREX) 500 MG tablet, Take 1 tablet (500 mg total) by mouth daily., Disp: 90 tablet, Rfl: 1  Allergies  Allergen Reactions  . Tetracycline Nausea And Vomiting    Review of Systems  Constitutional: Positive for chills. Negative for fever.  HENT: Positive for sore throat. Negative for ear discharge and trouble swallowing.   Respiratory: Positive for cough. Negative for sputum production and shortness of breath.   Musculoskeletal: Negative for neck pain.  Neurological: Positive for headaches.      Objective  Vitals:   05/12/16 1053  BP: 122/60  Pulse: 93  Resp: 16  Temp: 98.5 F (36.9 C)  TempSrc: Oral  SpO2: 96%  Weight: 128 lb 6.4 oz (58.2 kg)  Height: 5\' 4"  (1.626 m)    Physical Exam  Constitutional: She is oriented to person, place, and time and well-developed, well-nourished, and in no distress.  HENT:  Head: Normocephalic and atraumatic.  Right Ear: No drainage.  Left Ear: No drainage.  Nose: Right sinus exhibits maxillary sinus tenderness. Left sinus exhibits maxillary sinus tenderness.  Mouth/Throat: Posterior oropharyngeal erythema present. No oropharyngeal exudate.  Cerumen impaction in both ear canals  Cardiovascular: Normal rate, regular rhythm, S1 normal, S2 normal and normal heart  sounds.   No murmur heard. Pulmonary/Chest: Effort normal and breath sounds normal. She has no wheezes. She has no rhonchi.  Neurological: She is alert and oriented to person, place, and time.  Psychiatric: Mood, memory, affect and judgment normal.  Nursing note and vitals reviewed.     Assessment & Plan  1. Sore throat Rapid strep test is negative - POCT rapid strep A  2. Flu-like symptoms Rapid flu test is negative, likely viral upper respiratory infection, advised to take Tylenol for pain and fever, Robitussin for cough. If symptoms do not improve within next 3 days, she should call for an antibiotic. Patient verbalized agreement -  POCT Influenza A/B   Mafalda Mcginniss Asad A. [facility] 05/12/2016 11:18 AM

## 2016-05-16 ENCOUNTER — Telehealth: Payer: Self-pay | Admitting: Family Medicine

## 2016-05-16 DIAGNOSIS — R6889 Other general symptoms and signs: Secondary | ICD-10-CM

## 2016-05-16 NOTE — Telephone Encounter (Signed)
Patient was seen in the office on Fri. 05/12/16 by Dr. Manuella Ghazi for flu like symptoms and was told that if symptoms did not improve within next 3 days for patient to call back to the office for an antibiotic.

## 2016-05-16 NOTE — Telephone Encounter (Signed)
Routed to Dr. Manuella Ghazi for medication approval

## 2016-05-17 NOTE — Telephone Encounter (Signed)
Pt requesting return call from you today. [phone]

## 2016-05-18 MED ORDER — AZITHROMYCIN 250 MG PO TABS: ORAL_TABLET | ORAL | 0 refills | Status: DC

## 2016-05-18 NOTE — Telephone Encounter (Signed)
Contacted patient and left a voice message, have called in a prescription for azithromycin 250 mg to her pharmacy. Please inform patient

## 2016-05-18 NOTE — Telephone Encounter (Signed)
Called patient on 05/19/15 @ 5:13pm informing her that Dr. Manuella Ghazi called in the azithromycin to her pharmacy.

## 2016-06-23 ENCOUNTER — Ambulatory Visit
Admission: EM | Admit: 2016-06-23 | Discharge: 2016-06-23 | Disposition: A | Discharge status: Home / Self Care | Payer: 59 | Attending: Internal Medicine | Admitting: Internal Medicine

## 2016-06-23 ENCOUNTER — Ambulatory Visit (INDEPENDENT_AMBULATORY_CARE_PROVIDER_SITE_OTHER): Payer: 59

## 2016-06-23 DIAGNOSIS — J410 Simple chronic bronchitis: Secondary | ICD-10-CM

## 2016-06-23 DIAGNOSIS — Z029 Encounter for administrative examinations, unspecified: Secondary | ICD-10-CM | POA: Insufficient documentation

## 2016-06-23 MED ORDER — IPRATROPIUM-ALBUTEROL 20-100 MCG/ACT IN AERS: 1.0000 | INHALATION_SPRAY | Freq: Four times a day (QID) | RESPIRATORY_TRACT | 2 refills | Status: DC

## 2016-06-23 NOTE — ED Triage Notes (Signed)
Pt c/o dry cough since the week after christmas and was given a zpack by her PCP that did not help resolve the cough.. States she also has intermittent sharp pain in the center of her chest.. Denies anything that seems to bring on the chest pain like movement or deep breathing. Pain usually last only a few seconds.

## 2017-02-13 ENCOUNTER — Encounter: Payer: Self-pay | Admitting: Family Medicine

## 2017-02-13 ENCOUNTER — Ambulatory Visit (INDEPENDENT_AMBULATORY_CARE_PROVIDER_SITE_OTHER): Payer: BLUE CROSS/BLUE SHIELD | Admitting: Family Medicine

## 2017-02-13 VITALS — BP 116/80 | HR 76 | Ht 64.0 in | Wt 126.5 lb

## 2017-02-13 DIAGNOSIS — Z87440 Personal history of urinary (tract) infections: Secondary | ICD-10-CM

## 2017-02-13 DIAGNOSIS — R300 Dysuria: Secondary | ICD-10-CM

## 2017-02-13 DIAGNOSIS — F410 Panic disorder [episodic paroxysmal anxiety] without agoraphobia: Secondary | ICD-10-CM | POA: Diagnosis not present

## 2017-02-13 LAB — POCT URINALYSIS DIPSTICK
Bilirubin, UA: NEGATIVE
Glucose, UA: NEGATIVE
Ketones, UA: NEGATIVE
Leukocytes, UA: NEGATIVE
Nitrite, UA: NEGATIVE
Protein, UA: NEGATIVE
Spec Grav, UA: 1.01 (ref 1.010–1.025)
Urobilinogen, UA: 0.2 E.U./dL
pH, UA: 6 (ref 5.0–8.0)

## 2017-02-13 MED ORDER — ALPRAZOLAM 0.5 MG PO TABS: 0.5000 mg | ORAL_TABLET | Freq: Three times a day (TID) | ORAL | 2 refills | Status: DC | PRN

## 2017-02-13 MED ORDER — NITROFURANTOIN MACROCRYSTAL 100 MG PO CAPS: 100.0000 mg | ORAL_CAPSULE | Freq: Two times a day (BID) | ORAL | 0 refills | Status: AC

## 2017-02-13 NOTE — Progress Notes (Signed)
Name: Christine Ayala   MRN: [id number]    DOB: 31-Oct-1955   Date:02/13/2017       Progress Note  Subjective  Chief Complaint  Chief Complaint  Patient presents with  . Medication Refill  . Dysuria    Pressure, lower back pain,low grade fever  . Immunizations    getting flu at work next week     Dysuria   This is a new problem. The problem occurs intermittently. The quality of the pain is described as shooting. The pain is severe. There has been no fever (elevated temperature to 47F). She is not sexually active. There is a history of pyelonephritis. Associated symptoms include flank pain. Pertinent negatives include no chills, hematuria, nausea or vomiting. She has tried increased fluids (drinking pinepapple juice.) for the symptoms.  Anxiety  Presents for follow-up visit. Symptoms include excessive worry and nervous/anxious behavior. Patient reports no depressed mood, nausea or panic. The severity of symptoms is moderate and causing significant distress.       Past Medical History:  Diagnosis Date  . Anemia   . Depression   . GERD (gastroesophageal reflux disease)   . Headache    migraines/sinus  . Herpes genitalis   . PONV (postoperative nausea and vomiting)     Past Surgical History:  Procedure Laterality Date  . [place]  . COLONOSCOPY WITH PROPOFOL N/A 05/14/2015   Procedure: COLONOSCOPY WITH PROPOFOL;  Surgeon: Lucilla Lame, MD;  Location: [place];  Service: Endoscopy;  Laterality: N/A;  . FOOT SURGERY    . WRIST SURGERY      History reviewed. No pertinent family history.  Social History   Social History  . Marital status: Married    Spouse name: N/A  . Number of children: N/A  . Years of education: N/A   Occupational History  . Not on file.   Social History Main Topics  . Smoking status: Former Research scientist (life sciences)  . Smokeless tobacco: Never Used     Comment: quit 20 yrs ago  . Alcohol use No  . Drug use: No  . Sexual activity: Yes   Birth control/ protection: None   Other Topics Concern  . Not on file   Social History Narrative  . No narrative on file     Current Outpatient Prescriptions:  .  ALPRAZolam (XANAX) 0.5 MG tablet, Take 1 tablet (0.5 mg total) by mouth 3 (three) times daily as needed for anxiety., Disp: 90 tablet, Rfl: 2 .  Ipratropium-Albuterol (COMBIVENT RESPIMAT) 20-100 MCG/ACT AERS respimat, Inhale 1 puff into the lungs every 6 (six) hours., Disp: 1 Inhaler, Rfl: 2 .  lansoprazole (PREVACID) 30 MG capsule, Take 1 capsule (30 mg total) by mouth daily., Disp: 90 capsule, Rfl: 0 .  valACYclovir (VALTREX) 500 MG tablet, Take 1 tablet (500 mg total) by mouth daily., Disp: 90 tablet, Rfl: 1  Allergies  Allergen Reactions  . Tetracycline Nausea And Vomiting     Review of Systems  Constitutional: Negative for chills.  Gastrointestinal: Negative for nausea and vomiting.  Genitourinary: Positive for dysuria and flank pain. Negative for hematuria.  Psychiatric/Behavioral: The patient is nervous/anxious.      Objective  Vitals:   02/13/17 1619  BP: 116/80  Pulse: 76  Weight: 126 lb 8 oz (57.4 kg)  Height: 5\' 4"  (1.626 m)    Physical Exam  Constitutional: She is oriented to person, place, and time and well-developed, well-nourished, and in no distress.  HENT:  Head: Normocephalic and atraumatic.  Cardiovascular: Normal rate, regular rhythm and normal heart sounds.   No murmur heard. Pulmonary/Chest: Effort normal and breath sounds normal. She has no wheezes.  Abdominal: Soft. Bowel sounds are normal. There is CVA tenderness.  Neurological: She is alert and oriented to person, place, and time.  Nursing note and vitals reviewed.       Assessment & Plan  1. History of urinary infection  - POCT urinalysis dipstick  2. Dysuria Urinalysis is unremarkable, start on antibiotic based on symptoms and sent for microscopic exam and culture. Nephrolithiasis is also in the differential and may  need imaging - POCT urinalysis dipstick - nitrofurantoin (MACRODANTIN) 100 MG capsule; Take 1 capsule (100 mg total) by mouth 2 (two) times daily.  Dispense: 14 capsule; Refill: 0 - Urine Culture - Urinalysis, Routine w reflex microscopic  3. Panic disorder without agoraphobia Stable, responsive to alprazolam taken up to 3 times daily as needed, refills provided - ALPRAZolam (XANAX) 0.5 MG tablet; Take 1 tablet (0.5 mg total) by mouth 3 (three) times daily as needed for anxiety.  Dispense: 90 tablet; Refill: 2  Amazing Cowman Asad A[facility] 02/13/2017 4:28 PM

## 2017-02-14 LAB — URINALYSIS, ROUTINE W REFLEX MICROSCOPIC
Bilirubin Urine: NEGATIVE
Glucose, UA: NEGATIVE
Hgb urine dipstick: NEGATIVE
Ketones, ur: NEGATIVE
Leukocytes, UA: NEGATIVE
Nitrite: NEGATIVE
Protein, ur: NEGATIVE
Specific Gravity, Urine: 1.012 (ref 1.001–1.03)
pH: 6 (ref 5.0–8.0)

## 2017-02-14 LAB — URINE CULTURE
MICRO NUMBER:: 81092336
SPECIMEN QUALITY:: ADEQUATE

## 2017-02-16 ENCOUNTER — Telehealth: Payer: Self-pay

## 2017-02-16 NOTE — Telephone Encounter (Signed)
-----   Message from Roselee Nova, MD sent at 02/15/2017 10:03 AM EDT ----- Or culture shows a single organism less than 10,000 CFU's per mL, commonly found on external and internal genitalia, considered nonpathogenic. She does not have a UTI based on urine culture and should discontinue the antibiotics. If her symptoms of lower urinary tract irritation persist, may need a referral to urology for evaluation of possible kidney stones.

## 2017-02-16 NOTE — Telephone Encounter (Signed)
Called pt, no answer. LM for pt informing her of neg urine cx and the need to d/c antibiotics.

## 2017-02-21 DIAGNOSIS — L0390 Cellulitis, unspecified: Secondary | ICD-10-CM | POA: Diagnosis not present

## 2017-02-22 DIAGNOSIS — Z23 Encounter for immunization: Secondary | ICD-10-CM | POA: Diagnosis not present

## 2017-03-21 ENCOUNTER — Encounter: Payer: Self-pay | Admitting: Family Medicine

## 2017-03-21 ENCOUNTER — Ambulatory Visit: Payer: BLUE CROSS/BLUE SHIELD | Admitting: Family Medicine

## 2017-03-21 VITALS — BP 118/82 | HR 76 | Temp 98.2°F | Resp 16 | Ht 64.0 in | Wt 125.3 lb

## 2017-03-21 DIAGNOSIS — J069 Acute upper respiratory infection, unspecified: Secondary | ICD-10-CM | POA: Diagnosis not present

## 2017-03-21 DIAGNOSIS — R059 Cough, unspecified: Secondary | ICD-10-CM

## 2017-03-21 DIAGNOSIS — J029 Acute pharyngitis, unspecified: Secondary | ICD-10-CM | POA: Diagnosis not present

## 2017-03-21 DIAGNOSIS — R05 Cough: Secondary | ICD-10-CM

## 2017-03-21 LAB — POCT RAPID STREP A (OFFICE): Rapid Strep A Screen: NEGATIVE

## 2017-03-21 MED ORDER — BENZONATATE 100 MG PO CAPS: 100.0000 mg | ORAL_CAPSULE | Freq: Three times a day (TID) | ORAL | 0 refills | Status: DC | PRN

## 2017-03-21 MED ORDER — FLUTICASONE PROPIONATE 50 MCG/ACT NA SUSP: 2.0000 | Freq: Every day | NASAL | 0 refills | Status: DC

## 2017-03-21 NOTE — Patient Instructions (Addendum)
Use Combivent every 6 hours as needed for coughing or shortness of breath.  Infeccin del tracto respiratorio superior, adultos (Upper Respiratory Infection, Adult) La mayora de las infecciones del tracto respiratorio superior estn causadas por un virus. Un infeccin del tracto respiratorio superior afecta la nariz, la garganta y las vas respiratorias superiores. El tipo ms comn de infeccin del tracto respiratorio superior es el resfro comn. Galateo los medicamentos solamente como se lo haya indicado el mdico.  A fin de Best boy de garganta, haga grgaras con solucin salina templada o consuma caramelos para la tos, como se lo haya indicado el mdico.  Use un humidificador de vapor clido o inhale el vapor de la ducha para aumentar la humedad del aire. Esto facilitar la respiracin.  Beba suficiente lquido para mantener el pis (orina) claro o de color amarillo plido.  Tome sopas y caldos transparentes.  Siga una dieta saludable.  Descanse todo lo que sea necesario.  Regrese al [place] cuando la fiebre haya desaparecido o el mdico le diga que puede [place]. ? Es posible que deba quedarse en su casa durante un tiempo prolongado, para no transmitir la infeccin a los dems. ? Christine Ayala usar un barbijo y lavarse las manos con frecuencia para evitar el contagio del virus.  Si tiene asma, use el inhalador con mayor frecuencia.  No consuma ningn producto que contenga tabaco, lo que incluye cigarrillos, tabaco de Higher education careers adviser o Psychologist, sport and exercise. Si necesita ayuda para dejar de fumar, consulte al mdico. SOLICITE AYUDA SI:  Siente que empeora o que no mejora.  Los medicamentos no logran [employer].  Tiene escalofros.  La dificultad para Museum/gallery exhibitions officer.  Tiene mucosidad marrn o roja.  Tiene una secrecin amarilla o marrn de la Lawyer.  Le duele la cara, especialmente al inclinarse hacia adelante.  Tiene fiebre.  Tiene  los ganglios del cuello hinchados.  Siente dolor al tragar.  Tiene zonas blancas en la parte de atrs de la garganta. SOLICITE AYUDA DE INMEDIATO SI:  Los siguientes sntomas son muy intensos o constantes: ? Dolor de [place]. ? Dolor de odos. ? Dolor en la frente, detrs de los ojos y por encima de los pmulos (dolor sinusal). ? Tourist information centre manager.  Tiene enfermedad pulmonar prolongada (crnica) y cualquiera de estos sntomas: ? Sibilancias. ? Tos prolongada. ? Tos con Carma Lair. ? Cambio en la mucosidad habitual.  Presenta rigidez en el cuello.  Tiene cambios en: ? La visin. ? La audicin. ? El pensamiento. ? El [place] de nimo. ASEGRESE DE QUE:  Comprende estas instrucciones.  Controlar su afeccin.  Recibir ayuda de inmediato si no mejora o si empeora. Esta informacin no tiene Marine scientist el consejo del mdico. Asegrese de hacerle al mdico cualquier pregunta que tenga. Document Released: 10/03/2010 Document Revised: 09/15/2014 Document Reviewed: 08/06/2013 Elsevier Interactive Patient Education  2018 [employer].

## 2017-03-21 NOTE — Progress Notes (Signed)
Name: Christine Ayala   MRN: [id number]    DOB: [birth year]   Date:03/21/2017       Progress Note  Subjective  Chief Complaint  Chief Complaint  Patient presents with  . Sore Throat    cough, headace    HPI  Patient presents with c/o URI symptoms x2 days after spending a few days with her 59yo granddaughter in the hospital who recently had strep throat and a severe leg infection. Endorses sore throat, headache, dry cough in which she often has trouble stopping and occasionally causes gagging, some shortness of breath, sinus congestion, mild lightheadedness, some bilateral ear fullness. Former smoker. Denies NVD, abdominal pain, body aches, fevers/chills, chest pain. Rapid strep is negative - we will send for culture today. She has been taking a cold & flu medication w/ acetaminophen - discussed acetaminophen dosing. She still has combivent inhaler, but has not used it for her coughing.  Patient Active Problem List   Diagnosis Date Noted  . Flu-like symptoms 05/12/2016  . Well woman exam (no gynecological exam) 10/26/2015  . Persistent dry cough 08/31/2015  . History of herpes simplex infection 07/27/2015  . Special screening for malignant neoplasms, colon   . Acute non-recurrent ethmoidal sinusitis 04/26/2015  . Screening for colon cancer 04/26/2015  . Panic disorder without agoraphobia 11/10/2014  . Acid reflux 11/10/2014  . Herpes 11/10/2014  . Menopausal symptom 11/10/2014  . Screening examination for poliomyelitis 11/10/2014  . Calculus of kidney 11/10/2014    Social History   Tobacco Use  . Smoking status: Former Research scientist (life sciences)  . Smokeless tobacco: Never Used  . Tobacco comment: quit 20 yrs ago  Substance Use Topics  . Alcohol use: No    Alcohol/week: 0.0 oz     Current Outpatient Medications:  .  ALPRAZolam (XANAX) 0.5 MG tablet, Take 1 tablet (0.5 mg total) by mouth 3 (three) times daily as needed for anxiety., Disp: 90 tablet, Rfl: 2 .  Ipratropium-Albuterol (COMBIVENT  RESPIMAT) 20-100 MCG/ACT AERS respimat, Inhale 1 puff into the lungs every 6 (six) hours., Disp: 1 Inhaler, Rfl: 2 .  lansoprazole (PREVACID) 30 MG capsule, Take 1 capsule (30 mg total) by mouth daily., Disp: 90 capsule, Rfl: 0 .  valACYclovir (VALTREX) 500 MG tablet, Take 1 tablet (500 mg total) by mouth daily. (Patient not taking: Reported on 03/21/2017), Disp: 90 tablet, Rfl: 1  Allergies  Allergen Reactions  . Tetracycline Nausea And Vomiting    ROS Ten systems reviewed and is negative except as mentioned in HPI   Objective  Vitals:   03/21/17 1002  BP: 118/82  Pulse: 76  Resp: 16  Temp: 98.2 F (36.8 C)  TempSrc: Oral  SpO2: 95%  Weight: 125 lb 4.8 oz (56.8 kg)  Height: 5\' 4"  (1.626 m)   Body mass index is 21.51 kg/m.  Nursing Note and Vital Signs reviewed.  Physical Exam  Constitutional: Patient appears well-developed and well-nourished. No distress.  HEENT: head atraumatic, normocephalic, pupils equal and reactive to light, EOM's intact, TM's without erythema or bulging, mild maxillary and frontal sinus pain on palpation, neck supple without lymphadenopathy, oropharynx moderately erythematous and moist without exudate. Turbinates inflamed bilaterally Cardiovascular: Normal rate, regular rhythm, S1/S2 present.  No murmur or rub heard. No BLE edema. Pulmonary/Chest: Effort normal and breath sounds clear. No respiratory distress or retractions. Psychiatric: Patient has a normal mood and affect. behavior is normal. Judgment and thought content normal.  Recent Results (from the past 2160 hour(s))  POCT urinalysis dipstick  Status: Normal   Collection Time: 02/13/17  4:30 PM  Result Value Ref Range   Color, UA pale yellow    Clarity, UA clear    Glucose, UA neg    Bilirubin, UA neg    Ketones, UA neg    Spec Grav, UA 1.010 1.010 - 1.025   Blood, UA HT    pH, UA 6.0 5.0 - 8.0   Protein, UA neg    Urobilinogen, UA 0.2 0.2 or 1.0 E.U./dL   Nitrite, UA neg     Leukocytes, UA Negative Negative  Urine Culture     Status: None   Collection Time: 02/13/17  4:48 PM  Result Value Ref Range   MICRO NUMBER: [id number]    SPECIMEN QUALITY: ADEQUATE    Sample Source URINE, CLEAN CATCH    STATUS: FINAL    Result:      Single organism less than 10,000 CFU/mL isolated. These organisms, commonly found on external and internal genitalia, are considered colonizers. No further testing performed.  Urinalysis, Routine w reflex microscopic     Status: None   Collection Time: 02/13/17  4:54 PM  Result Value Ref Range   Color, Urine YELLOW YELLOW   APPearance CLEAR CLEAR   Specific Gravity, Urine 1.012 1.001 - 1.03   pH 6.0 5.0 - 8.0   Glucose, UA NEGATIVE NEGATIVE   Bilirubin Urine NEGATIVE NEGATIVE   Ketones, ur NEGATIVE NEGATIVE   Hgb urine dipstick NEGATIVE NEGATIVE   Protein, ur NEGATIVE NEGATIVE   Nitrite NEGATIVE NEGATIVE   Leukocytes, UA NEGATIVE NEGATIVE  POCT rapid strep A     Status: None   Collection Time: 03/21/17 10:15 AM  Result Value Ref Range   Rapid Strep A Screen Negative Negative     Assessment & Plan  1. Sore throat - POCT rapid strep A - Culture, Group A Strep - due to patient's recent very close proximity with granddaughter with strep infection.  2. Upper respiratory infection, viral - benzonatate (TESSALON) 100 MG capsule; Take 1 capsule (100 mg total) 3 (three) times daily as needed by mouth for cough.  Dispense: 20 capsule; Refill: 0 - fluticasone (FLONASE) 50 MCG/ACT nasal spray; Place 2 sprays daily into both nostrils.  Dispense: 16 g; Refill: 0  3. Cough - benzonatate (TESSALON) 100 MG capsule; Take 1 capsule (100 mg total) 3 (three) times daily as needed by mouth for cough.  Dispense: 20 capsule; Refill: 0 - Plan to use combivent PRN Q6H PRN for coughing or shortness of breath.  -Red flags and when to present for emergency care or RTC including fever >101.40F, chest pain, shortness of breath, new/worsening/un-resolving  symptoms, reviewed with patient at time of visit. Follow up and care instructions discussed and provided in AVS.

## 2017-03-23 LAB — CULTURE, GROUP A STREP
MICRO NUMBER:: 81252463
SPECIMEN QUALITY:: ADEQUATE

## 2017-04-19 ENCOUNTER — Other Ambulatory Visit: Payer: Self-pay | Admitting: Family Medicine

## 2017-04-19 DIAGNOSIS — J069 Acute upper respiratory infection, unspecified: Secondary | ICD-10-CM

## 2017-04-19 DIAGNOSIS — Z8619 Personal history of other infectious and parasitic diseases: Secondary | ICD-10-CM

## 2017-04-19 NOTE — Telephone Encounter (Signed)
Left message on machine to call back  

## 2017-04-19 NOTE — Telephone Encounter (Signed)
Flonase was given for an acute visit. If pt has not improved from this illness, she needs to be seen to be re-evaluated.

## 2017-05-10 ENCOUNTER — Other Ambulatory Visit: Payer: Self-pay | Admitting: Family Medicine

## 2017-05-10 DIAGNOSIS — J069 Acute upper respiratory infection, unspecified: Secondary | ICD-10-CM

## 2017-05-10 NOTE — Telephone Encounter (Signed)
Is patient feeling better or is she still using flonase? If using as daily med, I can provide refill.  If she has not improved from last visit with me, she needs to return for follow up.

## 2017-06-19 ENCOUNTER — Encounter: Payer: Self-pay | Admitting: Obstetrics and Gynecology

## 2017-06-19 ENCOUNTER — Ambulatory Visit (INDEPENDENT_AMBULATORY_CARE_PROVIDER_SITE_OTHER): Payer: BLUE CROSS/BLUE SHIELD | Admitting: Obstetrics and Gynecology

## 2017-06-19 VITALS — BP 98/52 | HR 62 | Ht 66.0 in | Wt 126.0 lb

## 2017-06-19 DIAGNOSIS — N200 Calculus of kidney: Secondary | ICD-10-CM | POA: Diagnosis not present

## 2017-06-19 DIAGNOSIS — Z01419 Encounter for gynecological examination (general) (routine) without abnormal findings: Secondary | ICD-10-CM | POA: Diagnosis not present

## 2017-06-19 DIAGNOSIS — R309 Painful micturition, unspecified: Secondary | ICD-10-CM | POA: Diagnosis not present

## 2017-06-19 DIAGNOSIS — Z1231 Encounter for screening mammogram for malignant neoplasm of breast: Secondary | ICD-10-CM

## 2017-06-19 DIAGNOSIS — Z1239 Encounter for other screening for malignant neoplasm of breast: Secondary | ICD-10-CM

## 2017-06-19 DIAGNOSIS — Z124 Encounter for screening for malignant neoplasm of cervix: Secondary | ICD-10-CM | POA: Diagnosis not present

## 2017-06-19 LAB — POCT URINALYSIS DIPSTICK
Bilirubin, UA: NEGATIVE
Glucose, UA: NEGATIVE
Ketones, UA: NEGATIVE
Leukocytes, UA: NEGATIVE
Nitrite, UA: NEGATIVE
Protein, UA: NEGATIVE
Spec Grav, UA: 1.01 (ref 1.010–1.025)
Urobilinogen, UA: NEGATIVE E.U./dL — AB
pH, UA: 5 (ref 5.0–8.0)

## 2017-06-19 NOTE — Progress Notes (Signed)
Gynecology Annual Exam  PCP: Roselee Nova, MD  Chief Complaint:  Chief Complaint  Patient presents with  . Gynecologic Exam    Breast pain in right side    History of Present Illness:Patient is a 62 y.o. G2P2003 presents for annual exam. The patient has no complaints today.   LMP: No LMP recorded. Patient is postmenopausal.   The patient is sexually active. She denies dyspareunia.  The patient does perform self breast exams.  There is no notable family history of breast or ovarian cancer in her family.  The patient wears seatbelts: yes.   The patient has regular exercise: not asked.    The patient denies current symptoms of depression.     She does have a history of kidney stones.  She believes she passes one recently.  On prior imaging she reportedly has several additional kidney stones that she said may or may not pass.    Review of Systems: Review of Systems  Constitutional: Negative for chills and fever.  HENT: Negative for congestion.   Respiratory: Negative for cough and shortness of breath.   Cardiovascular: Negative for chest pain and palpitations.  Gastrointestinal: Negative for abdominal pain, constipation, diarrhea, heartburn, nausea and vomiting.  Genitourinary: Negative for dysuria, frequency and urgency.  Skin: Negative for itching and rash.  Neurological: Negative for dizziness and headaches.  Endo/Heme/Allergies: Negative for polydipsia.  Psychiatric/Behavioral: Negative for depression.    Past Medical History:  Past Medical History:  Diagnosis Date  . Anemia   . Depression   . GERD (gastroesophageal reflux disease)   . Headache    migraines/sinus  . Herpes genitalis   . PONV (postoperative nausea and vomiting)     Past Surgical History:  Past Surgical History:  Procedure Laterality Date  . [place]  . COLONOSCOPY WITH PROPOFOL N/A 05/14/2015   Procedure: COLONOSCOPY WITH PROPOFOL;  Surgeon: Lucilla Lame, MD;  Location:  [facility];  Service: Endoscopy;  Laterality: N/A;  . FOOT SURGERY    . WRIST SURGERY      Gynecologic History:  No LMP recorded. Patient is postmenopausal.  Obstetric History: G2P2003  Family History:  Family History  Problem Relation Age of Onset  . Colon cancer Father   . Breast cancer Paternal Grandmother     Social History:  Social History   Socioeconomic History  . Marital status: Married    Spouse name: Not on file  . Number of children: Not on file  . Years of education: Not on file  . Highest education level: Not on file  Social Needs  . Financial resource strain: Not on file  . Food insecurity - worry: Not on file  . Food insecurity - inability: Not on file  . Transportation needs - medical: Not on file  . Transportation needs - non-medical: Not on file  Occupational History  . Not on file  Tobacco Use  . Smoking status: Former Research scientist (life sciences)  . Smokeless tobacco: Never Used  . Tobacco comment: quit 20 yrs ago  Substance and Sexual Activity  . Alcohol use: No    Alcohol/week: 0.0 oz  . Drug use: No  . Sexual activity: Yes    Birth control/protection: None  Other Topics Concern  . Not on file  Social History Narrative  . Not on file    Allergies:  Allergies  Allergen Reactions  . Tetracycline Nausea And Vomiting    Medications: Prior to Admission medications   Medication  Sig Start Date End Date Taking? Authorizing Provider  ALPRAZolam Duanne Moron) 0.5 MG tablet Take 1 tablet (0.5 mg total) by mouth 3 (three) times daily as needed for anxiety. 02/13/17  Yes Roselee Nova, MD  lansoprazole (PREVACID) 30 MG capsule Take 1 capsule (30 mg total) by mouth daily. 07/27/15  Yes Roselee Nova, MD  valACYclovir (VALTREX) 500 MG tablet TAKE 1 TABLET EVERY DAY 04/22/17  Yes Roselee Nova, MD    Physical Exam Vitals: Blood pressure (!) 98/52, pulse 62, height 5\' 6"  (1.676 m), weight 126 lb (57.2 kg).  General: NAD HEENT: normocephalic,  anicteric Thyroid: no enlargement, no palpable nodules Pulmonary: No increased work of breathing, CTAB Cardiovascular: RRR, distal pulses 2+ Breast: Breast symmetrical, no tenderness, no palpable nodules or masses, no skin or nipple retraction present, no nipple discharge.  No axillary or supraclavicular lymphadenopathy. Abdomen: NABS, soft, non-tender, non-distended.  Umbilicus without lesions.  No hepatomegaly, splenomegaly or masses palpable. No evidence of hernia  Genitourinary:  External: Normal external female genitalia.  Normal urethral meatus, normal  Bartholin's and Skene's glands.    Vagina: Normal vaginal mucosa, no evidence of prolapse.    Cervix: Grossly normal in appearance, no bleeding  Uterus: Non-enlarged, mobile, normal contour.  No CMT  Adnexa: ovaries non-enlarged, no adnexal masses  Rectal: deferred  Lymphatic: no evidence of inguinal lymphadenopathy Extremities: no edema, erythema, or tenderness Neurologic: Grossly intact Psychiatric: mood appropriate, affect full  Female chaperone present for pelvic and breast  portions of the physical exam     Assessment: 62 y.o. G2P2003 routine annual exam  Plan: Problem List Items Addressed This Visit    None    Visit Diagnoses    Pain with urination    -  Primary   Relevant Orders   POCT urinalysis dipstick (Completed)   Screening for malignant neoplasm of cervix       Relevant Orders   PapIG, HPV, rfx 16/18   Breast screening       Relevant Orders   MM DIGITAL SCREENING BILATERAL   Encounter for gynecological examination without abnormal finding       Relevant Orders   PapIG, HPV, rfx 16/18   Nephrolithiasis       Relevant Orders   Ambulatory referral to Urology      1) Mammogram - recommend yearly screening mammogram.  Mammogram Was ordered today  2) STI screening was not offered  3) ASCCP guidelines and rational discussed.  Patient opts for yearly screening interval  4) Osteoporosis  - per USPTF  routine screening DEXA at age 33  5) Routine healthcare maintenance including cholesterol, diabetes screening discussed managed by PCP  6) Colonoscopy up to date 05/14/2015 by Lucilla Lame, MD  7) Nephrolithiasis - referral to urology   8) Follow up 1 year for routine annual  Malachy Mood, MD, [place], [place]

## 2017-06-19 NOTE — Patient Instructions (Signed)
Preventive Care 40-64 Years, Female Preventive care refers to lifestyle choices and visits with your health care provider that can promote health and wellness. What does preventive care include?  A yearly physical exam. This is also called an annual well check.  Dental exams once or twice a year.  Routine eye exams. Ask your health care provider how often you should have your eyes checked.  Personal lifestyle choices, including: ? Daily care of your teeth and gums. ? Regular physical activity. ? Eating a healthy diet. ? Avoiding tobacco and drug use. ? Limiting alcohol use. ? Practicing safe sex. ? Taking low-dose aspirin daily starting at age 58. ? Taking vitamin and mineral supplements as recommended by your health care provider. What happens during an annual well check? The services and screenings done by your health care provider during your annual well check will depend on your age, overall health, lifestyle risk factors, and family history of disease. Counseling Your health care provider may ask you questions about your:  Alcohol use.  Tobacco use.  Drug use.  Emotional well-being.  Home and relationship well-being.  Sexual activity.  Eating habits.  Work and work Statistician.  Method of birth control.  Menstrual cycle.  Pregnancy history.  Screening You may have the following tests or measurements:  Height, weight, and BMI.  Blood pressure.  Lipid and cholesterol levels. These may be checked every 5 years, or more frequently if you are over 81 years old.  Skin check.  Lung cancer screening. You may have this screening every year starting at age 78 if you have a 30-pack-year history of smoking and currently smoke or have quit within the past 15 years.  Fecal occult blood test (FOBT) of the stool. You may have this test every year starting at age 65.  Flexible sigmoidoscopy or colonoscopy. You may have a sigmoidoscopy every 5 years or a colonoscopy  every 10 years starting at age 30.  Hepatitis C blood test.  Hepatitis B blood test.  Sexually transmitted disease (STD) testing.  Diabetes screening. This is done by checking your blood sugar (glucose) after you have not eaten for a while (fasting). You may have this done every 1-3 years.  Mammogram. This may be done every 1-2 years. Talk to your health care provider about when you should start having regular mammograms. This may depend on whether you have a family history of breast cancer.  BRCA-related cancer screening. This may be done if you have a family history of breast, ovarian, tubal, or peritoneal cancers.  Pelvic exam and Pap test. This may be done every 3 years starting at age 80. Starting at age 36, this may be done every 5 years if you have a Pap test in combination with an HPV test.  Bone density scan. This is done to screen for osteoporosis. You may have this scan if you are at high risk for osteoporosis.  Discuss your test results, treatment options, and if necessary, the need for more tests with your health care provider. Vaccines Your health care provider may recommend certain vaccines, such as:  Influenza vaccine. This is recommended every year.  Tetanus, diphtheria, and acellular pertussis (Tdap, Td) vaccine. You may need a Td booster every 10 years.  Varicella vaccine. You may need this if you have not been vaccinated.  Zoster vaccine. You may need this after age 5.  Measles, mumps, and rubella (MMR) vaccine. You may need at least one dose of MMR if you were born in  1955 or later. You may also need a second dose.  Pneumococcal 13-valent conjugate (PCV13) vaccine. You may need this if you have certain conditions and were not previously vaccinated.  Pneumococcal polysaccharide (PPSV23) vaccine. You may need one or two doses if you smoke cigarettes or if you have certain conditions.  Meningococcal vaccine. You may need this if you have certain  conditions.  Hepatitis A vaccine. You may need this if you have certain conditions or if you travel or work in places where you may be exposed to hepatitis A.  Hepatitis B vaccine. You may need this if you have certain conditions or if you travel or work in places where you may be exposed to hepatitis B.  Haemophilus influenzae type b (Hib) vaccine. You may need this if you have certain conditions.  Talk to your health care provider about which screenings and vaccines you need and how often you need them. This information is not intended to replace advice given to you by your health care provider. Make sure you discuss any questions you have with your health care provider. Document Released: 05/28/2015 Document Revised: 01/19/2016 Document Reviewed: 03/02/2015 Elsevier Interactive Patient Education  2018 Elsevier Inc.  

## 2017-06-23 LAB — PAPIG, HPV, RFX 16/18
HPV Genotype, 16: NEGATIVE
HPV Genotype, 18: NEGATIVE
HPV, high-risk: POSITIVE — AB
PAP Smear Comment: 0

## 2017-06-26 ENCOUNTER — Encounter: Payer: Self-pay | Admitting: Obstetrics and Gynecology

## 2017-06-26 ENCOUNTER — Telehealth: Payer: Self-pay | Admitting: Obstetrics and Gynecology

## 2017-06-26 NOTE — Telephone Encounter (Signed)
Please advise 

## 2017-06-26 NOTE — Telephone Encounter (Signed)
Pt is calling for results. Please advise. My leave detailed message

## 2017-07-09 ENCOUNTER — Ambulatory Visit (INDEPENDENT_AMBULATORY_CARE_PROVIDER_SITE_OTHER): Payer: BLUE CROSS/BLUE SHIELD | Admitting: Obstetrics and Gynecology

## 2017-07-09 ENCOUNTER — Encounter: Payer: Self-pay | Admitting: Obstetrics and Gynecology

## 2017-07-09 VITALS — BP 98/64 | HR 97 | Ht 66.0 in | Wt 128.0 lb

## 2017-07-09 DIAGNOSIS — R87810 Cervical high risk human papillomavirus (HPV) DNA test positive: Secondary | ICD-10-CM | POA: Diagnosis not present

## 2017-07-09 NOTE — Progress Notes (Signed)
   GYNECOLOGY CLINIC COLPOSCOPY PROCEDURE NOTE  62 y.o. [id number] here for colposcopy for HPV pap smear on 06/19/17. Discussed underlying role for HPV infection in the development of cervical dysplasia, its natural history and progression/regression, need for surveillance.  Is the patient  pregnant: No LMP: No LMP recorded. Patient is postmenopausal. Smoking status:  reports that she has quit smoking. she has never used smokeless tobacco. Contraception: post menopausal status  Patient given informed consent, signed copy in the chart, time out was performed.  The patient was position in dorsal lithotomy position. Speculum was placed the cervix was visualized.   After application of acetic acid colposcopic inspection of the cervix was undertaken.   Colposcopy adequate, full visualization of transformation zone: Yes no visible lesions; corresponding biopsies obtained.   ECC specimen obtained:  Yes  All specimens were labeled and sent to pathology.   Patient was given post procedure instructions.  Will follow up pathology and manage accordingly.  Routine preventative health maintenance measures emphasized.  OBGyn Exam  Malachy Mood, MD, [facility], [facility]

## 2017-07-10 ENCOUNTER — Ambulatory Visit: Payer: BLUE CROSS/BLUE SHIELD | Admitting: Urology

## 2017-07-11 LAB — PATHOLOGY

## 2017-07-12 ENCOUNTER — Encounter: Payer: Self-pay | Admitting: Obstetrics and Gynecology

## 2017-10-19 ENCOUNTER — Ambulatory Visit: Payer: BLUE CROSS/BLUE SHIELD | Admitting: Family Medicine

## 2017-10-19 ENCOUNTER — Encounter: Payer: Self-pay | Admitting: Family Medicine

## 2017-10-19 VITALS — BP 118/72 | HR 88 | Temp 98.2°F | Resp 14 | Ht 64.0 in | Wt 127.6 lb

## 2017-10-19 DIAGNOSIS — F410 Panic disorder [episodic paroxysmal anxiety] without agoraphobia: Secondary | ICD-10-CM

## 2017-10-19 DIAGNOSIS — Z23 Encounter for immunization: Secondary | ICD-10-CM | POA: Diagnosis not present

## 2017-10-19 MED ORDER — HYDROXYZINE PAMOATE 25 MG PO CAPS: 25.0000 mg | ORAL_CAPSULE | Freq: Three times a day (TID) | ORAL | 0 refills | Status: DC | PRN

## 2017-10-19 MED ORDER — TETANUS-DIPHTH-ACELL PERTUSSIS 5-2.5-18.5 LF-MCG/0.5 IM SUSP
0.5000 mL | Freq: Once | INTRAMUSCULAR | Status: AC
  Administered 2017-10-19: 0.5 mL via INTRAMUSCULAR

## 2017-10-19 NOTE — Patient Instructions (Addendum)
Try the new medicine for anxiety We'll make an appointment for you for the psychiatrist; you are welcome to cancel that if desired  You received the vaccine to protect against tetanus and diphtheria and pertussis today; the tetanus and diphtheria portions will provide protection up to ten years, and the pertussis component will give you protection against whooping cough for life  12 Ways to Curb Anxiety  ?Anxiety is normal human sensation. It is what helped our ancestors survive the pitfalls of the wilderness. Anxiety is defined as experiencing worry or nervousness about an imminent event or something with an uncertain outcome. It is a feeling experienced by most people at some point in their lives. Anxiety can be triggered by a very personal issue, such as the illness of a loved one, or an event of global proportions, such as a refugee crisis. Some of the symptoms of anxiety are:  Feeling restless.  Having a feeling of impending danger.  Increased heart rate.  Rapid breathing. Sweating.  Shaking.  Weakness or feeling tired.  Difficulty concentrating on anything except the current worry.  Insomnia.  Stomach or bowel problems. What can we do about anxiety we may be feeling? There are many techniques to help manage stress and relax. Here are 12 ways you can reduce your anxiety almost immediately: 1. Turn off the constant feed of information. Take a social media sabbatical. Studies have shown that social media directly contributes to social anxiety.  2. Monitor your television viewing habits. Are you watching shows that are also contributing to your anxiety, such as 24-hour news stations? Try watching something else, or better yet, nothing at all. Instead, listen to music, read an inspirational book or practice a hobby. 3. Eat nutritious meals. Also, don't skip meals and keep healthful snacks on hand. Hunger and poor diet contributes to feeling anxious. 4. Sleep. Sleeping on a regular schedule for  at least seven to eight hours a night will do wonders for your outlook when you are awake. 5. Exercise. Regular exercise will help rid your body of that anxious energy and help you get more restful sleep. 6. Try deep (diaphragmatic) breathing. Inhale slowly through your nose for five seconds and exhale through your mouth. 7. Practice acceptance and gratitude. When anxiety hits, accept that there are things out of your control that shouldn't be of immediate concern.  8. Seek out humor. When anxiety strikes, watch a funny video, read jokes or call a friend who makes you laugh. Laughter is healing for our bodies and releases endorphins that are calming. 9. Stay positive. Take the effort to replace negative thoughts with positive ones. Try to see a stressful situation in a positive light. Try to come up with solutions rather than dwelling on the problem. 10. Figure out what triggers your anxiety. Keep a journal and make note of anxious moments and the events surrounding them. This will help you identify triggers you can avoid or even eliminate. 11. Talk to someone. Let a trusted friend, family member or even trained professional know that you are feeling overwhelmed and anxious. Verbalize what you are feeling and why.  12. Volunteer. If your anxiety is triggered by a crisis on a large scale, become an advocate and work to resolve the problem that is causing you unease. Anxiety is often unwelcome and can become overwhelming. If not kept in check, it can become a disorder that could require medical treatment. However, if you take the time to care for yourself and avoid the  triggers that make you anxious, you will be able to find moments of relaxation and clarity that make your life much more enjoyable.   Steps to Elicit the Relaxation Response The following is the technique reprinted with permission from Dr. Billie Ruddy book The Relaxation Response pages 162-163 1. Sit quietly in a comfortable  position. 2. Close your eyes. 3. Deeply relax all your muscles,  beginning at your feet and progressing up to your face.  Keep them relaxed. 4. Breathe through your nose.  Become aware of your breathing.  As you breathe out, say the word, "one"*,  silently to yourself. For example,  breathe in ... out, "one",- in .. out, "one", etc.  Breathe easily and naturally. 5. Continue for 10 to 20 minutes.  You may open your eyes to check the time, but do not use an alarm.  When you finish, sit quietly for several minutes,  at first with your eyes closed and later with your eyes opened.  Do not stand up for a few minutes. 6. Do not worry about whether you are successful  in achieving a deep level of relaxation.  Maintain a passive attitude and permit relaxation to occur at its own pace.  When distracting thoughts occur,  try to ignore them by not dwelling upon them  and return to repeating "one."  With practice, the response should come with little effort.  Practice the technique once or twice daily,  but not within two hours after any meal,  since the digestive processes seem to interfere with  the elicitation of the Relaxation Response. * It is better to use a soothing, mellifluous sound, preferably with no meaning. or association, to avoid stimulation of unnecessary thoughts - a mantra.

## 2017-10-19 NOTE — Progress Notes (Signed)
BP 118/72   Pulse 88   Temp 98.2 F (36.8 C) (Oral)   Resp 14   Ht 5\' 4"  (1.626 m)   Wt 127 lb 9.6 oz (57.9 kg)   SpO2 96%   BMI 21.90 kg/m    Subjective:    Patient ID: Christine Ayala, female    DOB: Aug 05, 1955, 62 y.o.   MRN: [id number]  HPI: Christine Ayala is a 62 y.o. female  Chief Complaint  Patient presents with  . Follow-up  . Medication Refill    HPI Patient is new to me; previous provider left our practice  She is out of her medicine called alprazolam; the girl told her that they sent a letter that we were no longer going to prescribe that; she didn't realize it was for this; she will be 28 in August and she thinks she was put on xanax at age 44; she was having panic attacks; thought she was having a heart attack; went to the doctor; not heart attack, but panic attacks She has been a [place] pig to try to prevent the panic attacks, but they made her feel drugged They thought one would be really good, but made her feel paranoid and gritted her teeth; she does accounting work, 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; she is getting her 2nd associate's degree Panic attacks run in the family; sister has schizophrenia and bipolar disorder; she is on a lot of medicine; her brother has a lot of panic attacks but different; he takes something; he was an alcoholic, sister is an alcoholic; mother's side is "really crazy"; half of them  She'll take a half or a quarter of a pill when anxious or has an overwhelmed feeling, like she has an exam, working 10 hours, overloaded, taking momma to the doctor, that's when she needs something  Depression screen [facility] 2/9 10/19/2017 03/21/2016 10/26/2015 08/31/2015 07/27/2015  Decreased Interest 0 0 0 0 0  Down, Depressed, Hopeless 0 0 0 0 0  PHQ - 2 Score 0 0 0 0 0  Altered sleeping 2 - - - -  Tired, decreased energy 3 - - - -  Change in appetite 1 - - - -  Feeling bad or failure about yourself  0 - - - -  Trouble concentrating 3 - - - -  Moving slowly or  fidgety/restless 0 - - - -  Suicidal thoughts 0 - - - -  PHQ-9 Score 9 - - - -  Difficult doing work/chores Somewhat difficult - - - -    Relevant past medical, surgical, family and social history reviewed Past Medical History:  Diagnosis Date  . Anemia   . Depression   . Family history of breast cancer    genetic testing letter sent 2/19  . GERD (gastroesophageal reflux disease)   . Headache    migraines/sinus  . Herpes genitalis   . PONV (postoperative nausea and vomiting)    Past Surgical History:  Procedure Laterality Date  . [place]  . COLONOSCOPY WITH PROPOFOL N/A 05/14/2015   Procedure: COLONOSCOPY WITH PROPOFOL;  Surgeon: Lucilla Lame, MD;  Location: [place];  Service: Endoscopy;  Laterality: N/A;  . FOOT SURGERY    . WRIST SURGERY     Family History  Problem Relation Age of Onset  . Breast cancer Paternal Grandmother 32  . Breast cancer Paternal Aunt 10  . Heart disease Mother   . Hyperlipidemia Mother   . Hypertension Mother   .  Pneumonia Sister   . Schizophrenia Sister    Social History   Tobacco Use  . Smoking status: Former Research scientist (life sciences)  . Smokeless tobacco: Never Used  . Tobacco comment: quit 20 yrs ago  Substance Use Topics  . Alcohol use: No    Alcohol/week: 0.0 oz  . Drug use: No    Interim medical history since last visit reviewed. Allergies and medications reviewed  Review of Systems Per HPI unless specifically indicated above     Objective:    BP 118/72   Pulse 88   Temp 98.2 F (36.8 C) (Oral)   Resp 14   Ht 5\' 4"  (1.626 m)   Wt 127 lb 9.6 oz (57.9 kg)   SpO2 96%   BMI 21.90 kg/m   Wt Readings from Last 3 Encounters:  10/19/17 127 lb 9.6 oz (57.9 kg)  07/09/17 128 lb (58.1 kg)  06/19/17 126 lb (57.2 kg)    Physical Exam  Constitutional: She appears well-developed and well-nourished.  HENT:  Mouth/Throat: Mucous membranes are normal.  Eyes: EOM are normal. No scleral icterus.  Cardiovascular: Normal  rate and regular rhythm.  Pulmonary/Chest: Effort normal and breath sounds normal.  Psychiatric: She has a normal mood and affect. Her behavior is normal.       Assessment & Plan:   Problem List Items Addressed This Visit      Other   Panic disorder without agoraphobia - Primary    Explained that I won't be prescribing benzo; essentially acts like alcohol to body and brain; willing to prescribe other non-benzo options for her anxiety and panic; she agreed to try hydroxyzine; refer to psychiatrist; encouraged relaxation techniques; see AVS      Relevant Medications   hydrOXYzine (VISTARIL) 25 MG capsule   Other Relevant Orders   Ambulatory referral to Psychiatry    Other Visit Diagnoses    Need for Tdap vaccination       Relevant Medications   Tdap (BOOSTRIX) injection 0.5 mL (Completed)   Other Relevant Orders   Hepatitis C Antibody (Completed)       Follow up plan: No follow-ups on file.  An after-visit summary was printed and given to the patient at [facility].  Please see the patient instructions which may contain other information and recommendations beyond what is mentioned above in the assessment and plan.  Meds ordered this encounter  Medications  . Tdap (BOOSTRIX) injection 0.5 mL  . hydrOXYzine (VISTARIL) 25 MG capsule    Sig: Take 1 capsule (25 mg total) by mouth every 8 (eight) hours as needed.    Dispense:  30 capsule    Refill:  0    Orders Placed This Encounter  Procedures  . Hepatitis C Antibody  . Ambulatory referral to Psychiatry   Face-to-face time with patient was more than 15 minutes, >50% time spent counseling and coordination of care

## 2017-10-20 LAB — HEPATITIS C ANTIBODY
Hepatitis C Ab: NONREACTIVE
SIGNAL TO CUT-OFF: 0.01 (ref <1.00)

## 2017-10-30 NOTE — Assessment & Plan Note (Signed)
Explained that I won't be prescribing benzo; essentially acts like alcohol to body and brain; willing to prescribe other non-benzo options for her anxiety and panic; she agreed to try hydroxyzine; refer to psychiatrist; encouraged relaxation techniques; see AVS

## 2018-01-09 DIAGNOSIS — R234 Changes in skin texture: Secondary | ICD-10-CM | POA: Diagnosis not present

## 2018-01-09 DIAGNOSIS — L011 Impetiginization of other dermatoses: Secondary | ICD-10-CM | POA: Diagnosis not present

## 2018-01-24 ENCOUNTER — Telehealth: Payer: Self-pay

## 2018-01-24 DIAGNOSIS — F410 Panic disorder [episodic paroxysmal anxiety] without agoraphobia: Secondary | ICD-10-CM

## 2018-01-24 NOTE — Telephone Encounter (Signed)
I have put in an order for her to see a psychiatrist I removed hydroxyzine from med list; tell her to not take any more

## 2018-01-24 NOTE — Telephone Encounter (Signed)
Copied from [place] [phone]. Topic: General - Other >> Jan 24, 2018 12:27 PM Carolyn Stare wrote:  Pt saw Dr Sanda Klein in June and was given a RX that she does not remember the name of. She said the medicine did not work for her. She said the med did not relax her it gave her a funny feeling and it kept her woke  She said she was told she was going to be referred to see someone about getting her Duanne Moron

## 2018-01-25 NOTE — Telephone Encounter (Signed)
Patient called.  Patient aware.  

## 2018-01-29 DIAGNOSIS — L821 Other seborrheic keratosis: Secondary | ICD-10-CM | POA: Diagnosis not present

## 2018-01-29 DIAGNOSIS — D485 Neoplasm of uncertain behavior of skin: Secondary | ICD-10-CM | POA: Diagnosis not present

## 2018-02-01 DIAGNOSIS — Z1231 Encounter for screening mammogram for malignant neoplasm of breast: Secondary | ICD-10-CM | POA: Diagnosis not present

## 2018-02-01 LAB — HM MAMMOGRAPHY

## 2018-02-18 ENCOUNTER — Ambulatory Visit: Payer: Self-pay | Admitting: Psychiatry

## 2018-02-19 DIAGNOSIS — D485 Neoplasm of uncertain behavior of skin: Secondary | ICD-10-CM | POA: Diagnosis not present

## 2018-02-19 DIAGNOSIS — R234 Changes in skin texture: Secondary | ICD-10-CM | POA: Diagnosis not present

## 2018-02-22 DIAGNOSIS — Z23 Encounter for immunization: Secondary | ICD-10-CM | POA: Diagnosis not present

## 2018-02-25 ENCOUNTER — Encounter: Payer: Self-pay | Admitting: Family Medicine

## 2018-03-26 DIAGNOSIS — R42 Dizziness and giddiness: Secondary | ICD-10-CM | POA: Diagnosis not present

## 2018-03-26 DIAGNOSIS — Z6821 Body mass index (BMI) 21.0-21.9, adult: Secondary | ICD-10-CM | POA: Diagnosis not present

## 2018-03-26 DIAGNOSIS — R002 Palpitations: Secondary | ICD-10-CM | POA: Diagnosis not present

## 2018-05-17 DIAGNOSIS — Z6820 Body mass index (BMI) 20.0-20.9, adult: Secondary | ICD-10-CM | POA: Diagnosis not present

## 2018-05-17 DIAGNOSIS — Z0000 Encounter for general adult medical examination without abnormal findings: Secondary | ICD-10-CM | POA: Diagnosis not present

## 2018-05-17 DIAGNOSIS — F410 Panic disorder [episodic paroxysmal anxiety] without agoraphobia: Secondary | ICD-10-CM | POA: Diagnosis not present

## 2018-05-17 DIAGNOSIS — K219 Gastro-esophageal reflux disease without esophagitis: Secondary | ICD-10-CM | POA: Diagnosis not present

## 2018-06-17 DIAGNOSIS — R05 Cough: Secondary | ICD-10-CM | POA: Diagnosis not present

## 2018-06-17 DIAGNOSIS — Z6820 Body mass index (BMI) 20.0-20.9, adult: Secondary | ICD-10-CM | POA: Diagnosis not present

## 2018-06-17 DIAGNOSIS — J029 Acute pharyngitis, unspecified: Secondary | ICD-10-CM | POA: Diagnosis not present

## 2018-06-17 DIAGNOSIS — Z20828 Contact with and (suspected) exposure to other viral communicable diseases: Secondary | ICD-10-CM | POA: Diagnosis not present

## 2018-09-23 DIAGNOSIS — B009 Herpesviral infection, unspecified: Secondary | ICD-10-CM | POA: Diagnosis not present

## 2018-09-23 DIAGNOSIS — K219 Gastro-esophageal reflux disease without esophagitis: Secondary | ICD-10-CM | POA: Diagnosis not present

## 2018-09-23 DIAGNOSIS — Z6820 Body mass index (BMI) 20.0-20.9, adult: Secondary | ICD-10-CM | POA: Diagnosis not present

## 2018-09-23 DIAGNOSIS — F419 Anxiety disorder, unspecified: Secondary | ICD-10-CM | POA: Diagnosis not present

## 2019-01-07 DIAGNOSIS — F4001 Agoraphobia with panic disorder: Secondary | ICD-10-CM | POA: Diagnosis not present

## 2019-01-07 DIAGNOSIS — Z7184 Encounter for health counseling related to travel: Secondary | ICD-10-CM | POA: Diagnosis not present

## 2019-01-07 DIAGNOSIS — F419 Anxiety disorder, unspecified: Secondary | ICD-10-CM | POA: Diagnosis not present

## 2019-01-07 DIAGNOSIS — Z209 Contact with and (suspected) exposure to unspecified communicable disease: Secondary | ICD-10-CM | POA: Diagnosis not present

## 2019-01-07 DIAGNOSIS — K219 Gastro-esophageal reflux disease without esophagitis: Secondary | ICD-10-CM | POA: Diagnosis not present

## 2019-01-07 DIAGNOSIS — Z0184 Encounter for antibody response examination: Secondary | ICD-10-CM | POA: Diagnosis not present

## 2019-01-23 ENCOUNTER — Ambulatory Visit: Payer: Self-pay

## 2019-01-23 ENCOUNTER — Other Ambulatory Visit: Payer: Self-pay

## 2019-01-23 VITALS — BP 108/62 | HR 61 | Temp 97.8°F | Resp 14

## 2019-01-23 DIAGNOSIS — R42 Dizziness and giddiness: Secondary | ICD-10-CM

## 2019-01-23 DIAGNOSIS — Z0130 Encounter for examination of blood pressure without abnormal findings: Secondary | ICD-10-CM

## 2019-01-23 LAB — GLUCOSE, POCT (MANUAL RESULT ENTRY): POC Glucose: 124 mg/dl — AB (ref 70–99)

## 2019-01-23 NOTE — Patient Instructions (Signed)

## 2019-01-23 NOTE — Progress Notes (Signed)
   Subjective:    Patient ID: Christine Ayala, female    DOB: Jun 24, 1955, 63 y.o.   MRN: [id number]  Patient presents to clinic with c/o feeling "dizzy and swimming headed" & requesting to have BP checked. Patient states that she felt bad this morning when she got up for work and felt uncomfortable driving to work. She states that she drank coffee and added lots of sugar in the event her it was her blood sugar.     Review of Systems  Neurological: Positive for dizziness.  All other systems reviewed and are negative.      Objective:   Physical Exam Vitals signs reviewed.  Constitutional:      Appearance: Normal appearance.  Cardiovascular:     Rate and Rhythm: Normal rate and regular rhythm.     Pulses: Normal pulses.  Pulmonary:     Effort: Pulmonary effort is normal.     Breath sounds: Normal breath sounds.  Musculoskeletal: Normal range of motion.  Skin:    General: Skin is warm and dry.     Capillary Refill: Capillary refill takes less than 2 seconds.  Neurological:     Mental Status: She is alert.  Psychiatric:        Mood and Affect: Mood normal.        Behavior: Behavior normal.     Assessment & Plan:   Wt Readings from Last 3 Encounters:  10/19/17 127 lb 9.6 oz (57.9 kg)  07/09/17 128 lb (58.1 kg)  06/19/17 126 lb (57.2 kg)   Temp Readings from Last 3 Encounters:  01/23/19 97.8 F (36.6 C) (Temporal)  10/19/17 98.2 F (36.8 C) (Oral)  03/21/17 98.2 F (36.8 C) (Oral)   BP Readings from Last 3 Encounters:  01/23/19 108/62  10/19/17 118/72  07/09/17 98/64   Pulse Readings from Last 3 Encounters:  01/23/19 61  10/19/17 88  07/09/17 97     Lab Results  Component Value Date   POCGLU 124 (A) 01/23/2019    Patient advised she should follow up with PCP for further evaluation at this time.       Thank you!!  Apolonio Schneiders RN  [place] Nurse Specialist [place]: [phone]   Cell:  [phone] Website: [email or web address]

## 2019-01-24 DIAGNOSIS — G43101 Migraine with aura, not intractable, with status migrainosus: Secondary | ICD-10-CM | POA: Diagnosis not present

## 2019-01-24 DIAGNOSIS — Z23 Encounter for immunization: Secondary | ICD-10-CM | POA: Diagnosis not present

## 2019-01-24 DIAGNOSIS — Z6820 Body mass index (BMI) 20.0-20.9, adult: Secondary | ICD-10-CM | POA: Diagnosis not present

## 2019-04-28 DIAGNOSIS — G43809 Other migraine, not intractable, without status migrainosus: Secondary | ICD-10-CM | POA: Diagnosis not present

## 2019-04-28 DIAGNOSIS — Z6820 Body mass index (BMI) 20.0-20.9, adult: Secondary | ICD-10-CM | POA: Diagnosis not present

## 2019-04-28 DIAGNOSIS — F419 Anxiety disorder, unspecified: Secondary | ICD-10-CM | POA: Diagnosis not present

## 2019-04-28 DIAGNOSIS — K219 Gastro-esophageal reflux disease without esophagitis: Secondary | ICD-10-CM | POA: Diagnosis not present

## 2019-07-31 ENCOUNTER — Ambulatory Visit: Payer: Self-pay | Attending: Internal Medicine

## 2019-07-31 DIAGNOSIS — Z23 Encounter for immunization: Secondary | ICD-10-CM

## 2019-07-31 NOTE — Progress Notes (Signed)
   Covid-19 Vaccination Clinic  Name:  Marque Plamondon    MRN: [id number] DOB: [birth year]  07/31/2019  Ms. Swoboda was observed post Covid-19 immunization for 15 minutes without incident. She was provided with Vaccine Information Sheet and instruction to access the V-Safe system.   Ms. Ramberg was instructed to call 911 with any severe reactions post vaccine: Marland Kitchen Difficulty breathing  . Swelling of face and throat  . A fast heartbeat  . A bad rash all over body  . Dizziness and weakness   Immunizations Administered    Name Date Dose VIS Date Route   Pfizer COVID-19 Vaccine 07/31/2019  9:23 AM 0.3 mL 04/25/2019 Intramuscular   Manufacturer: [place]   Lot: [id number]   [place]: [id number]

## 2019-08-27 ENCOUNTER — Ambulatory Visit: Payer: Self-pay

## 2021-03-08 ENCOUNTER — Other Ambulatory Visit: Payer: Self-pay | Admitting: Internal Medicine

## 2021-03-08 ENCOUNTER — Other Ambulatory Visit: Payer: Self-pay | Admitting: Family Medicine

## 2021-03-08 DIAGNOSIS — Z139 Encounter for screening, unspecified: Secondary | ICD-10-CM

## 2021-03-11 ENCOUNTER — Other Ambulatory Visit: Payer: Self-pay

## 2021-03-11 ENCOUNTER — Ambulatory Visit
Admission: RE | Admit: 2021-03-11 | Discharge: 2021-03-11 | Disposition: A | Discharge status: Home / Self Care | Payer: BC Managed Care – PPO | Source: Ambulatory Visit | Attending: Internal Medicine | Admitting: Internal Medicine

## 2021-03-11 DIAGNOSIS — Z139 Encounter for screening, unspecified: Secondary | ICD-10-CM

## 2021-03-21 ENCOUNTER — Other Ambulatory Visit: Payer: Self-pay | Admitting: Internal Medicine

## 2021-03-21 DIAGNOSIS — R928 Other abnormal and inconclusive findings on diagnostic imaging of breast: Secondary | ICD-10-CM

## 2021-05-31 ENCOUNTER — Encounter: Payer: BC Managed Care – PPO | Admitting: Genetic Counselor

## 2022-05-29 ENCOUNTER — Inpatient Hospital Stay (HOSPITAL_BASED_OUTPATIENT_CLINIC_OR_DEPARTMENT_OTHER): Payer: BC Managed Care – PPO | Admitting: Hematology and Oncology

## 2022-05-29 ENCOUNTER — Inpatient Hospital Stay: Payer: BC Managed Care – PPO

## 2022-05-29 VITALS — BP 137/90 | HR 72 | Temp 97.7°F | Resp 17 | Wt 114.7 lb

## 2022-05-29 DIAGNOSIS — Z170 Estrogen receptor positive status [ER+]: Secondary | ICD-10-CM | POA: Insufficient documentation

## 2022-05-29 DIAGNOSIS — Z803 Family history of malignant neoplasm of breast: Secondary | ICD-10-CM | POA: Insufficient documentation

## 2022-05-29 DIAGNOSIS — Z87891 Personal history of nicotine dependence: Secondary | ICD-10-CM

## 2022-05-29 DIAGNOSIS — C50511 Malignant neoplasm of lower-outer quadrant of right female breast: Secondary | ICD-10-CM | POA: Insufficient documentation

## 2022-05-29 NOTE — Assessment & Plan Note (Signed)
04/21/2022: Screening mammogram detected right breast mass measuring 0.9 cm (workup performed at [facility]) biopsy revealed grade 2 IDC with DCIS ER 100%, PR 100%, HER2 1+: Negative  Pathology and radiology counseling:Discussed with the patient, the details of pathology including the type of breast cancer,the clinical staging, the significance of ER, PR and HER-2/neu receptors and the implications for treatment. After reviewing the pathology in detail, we proceeded to discuss the different treatment options between surgery, radiation, chemotherapy, antiestrogen therapies.  Recommendations: 1. Breast conserving surgery followed by 2. Oncotype DX testing to determine if chemotherapy would be of any benefit followed by 3. Adjuvant radiation therapy followed by 4. Adjuvant antiestrogen therapy  Oncotype counseling: I discussed Oncotype DX test. I explained to the patient that this is a 21 gene panel to evaluate patient tumors DNA to calculate recurrence score. This would help determine whether patient has high risk or low risk breast cancer. She understands that if her tumor was found to be high risk, she would benefit from systemic chemotherapy. If low risk, no need of chemotherapy.  Return to clinic after surgery to discuss final pathology report and then determine if Oncotype DX testing will need to be sent.

## 2022-05-29 NOTE — Progress Notes (Signed)
Accident CONSULT NOTE  Patient Care Team: Lada, Satira Anis, MD as PCP - General (Family Medicine)  CHIEF COMPLAINTS/PURPOSE OF CONSULTATION:  Newly diagnosed breast cancer  HISTORY OF PRESENTING ILLNESS:  Christine Ayala 67 y.o. female is here because of recent diagnosis of right breast cancer.  Patient had routine screening mammogram which detected a 0.9 cm right breast mass.  Initial workup was performed at [facility]  Biopsy of it revealed grade 2 IDC with DCIS with mucinous features that was ER/PR positive HER2 negative.  She wanted to be seen at [facility] and therefore she made an appointment to come in and be seen.  She is accompanied today by her daughters and her granddaughter and her husband.  She tells me that she is having a lot of pain in the tailbone area and she attributes this to an injection that was given before her breast biopsy.  I reviewed her records extensively and collaborated the history with the patient.  SUMMARY OF ONCOLOGIC HISTORY: Oncology History  Malignant neoplasm of lower-outer quadrant of right breast of female, estrogen receptor positive ([place])  05/22/2022 Initial Diagnosis   04/21/2022: Screening mammogram detected right breast mass measuring 0.9 cm (workup performed at [facility]) biopsy revealed grade 2 IDC with DCIS ER 100%, PR 100%, HER2 1+: Negative      MEDICAL HISTORY:  Past Medical History:  Diagnosis Date   Anemia    Depression    Family history of breast cancer    genetic testing letter sent 2/19   GERD (gastroesophageal reflux disease)    Headache    migraines/sinus   Herpes genitalis    PONV (postoperative nausea and vomiting)     SURGICAL HISTORY: Past Surgical History:  Procedure Laterality Date   CESAREAN SECTION  1978   COLONOSCOPY WITH PROPOFOL N/A 05/14/2015   Procedure: COLONOSCOPY WITH PROPOFOL;  Surgeon: Lucilla Lame, MD;  Location: [place];  Service: Endoscopy;  Laterality: N/A;   FOOT SURGERY     WRIST  SURGERY      SOCIAL HISTORY: Social History   Socioeconomic History   Marital status: Married    Spouse name: Not on file   Number of children: Not on file   Years of education: Not on file   Highest education level: Not on file  Occupational History   Not on file  Tobacco Use   Smoking status: Former   Smokeless tobacco: Never   Tobacco comments:    quit 20 yrs ago  Vaping Use   Vaping Use: Never used  Substance and Sexual Activity   Alcohol use: No    Alcohol/week: 0.0 standard drinks of alcohol   Drug use: No   Sexual activity: Yes    Birth control/protection: None  Other Topics Concern   Not on file  Social History Narrative   Not on file   Social Determinants of Health   Financial Resource Strain: Not on file  Food Insecurity: Not on file  Transportation Needs: Not on file  Physical Activity: Insufficiently Active (06/19/2017)   Exercise Vital Sign    Days of Exercise per Week: 5 days    Minutes of Exercise per Session: 10 min  Stress: No Stress Concern Present (06/19/2017)   [place]    Feeling of Stress : Not at all  Social Connections: [place] (06/19/2017)   Social Connection and Isolation Panel [NHANES]    Frequency of Communication with Friends and Family:  More than three times a week    Frequency of Social Gatherings with Friends and Family: Once a week    Attends Religious Services: More than 4 times per year    Active Member of [employer] or Organizations: Yes    Attends Music therapist: More than 4 times per year    Marital Status: Married  Human resources officer Violence: Not At Risk (06/19/2017)   Humiliation, Afraid, Rape, and Kick questionnaire    Fear of Current or Ex-Partner: No    Emotionally Abused: No    Physically Abused: No    Sexually Abused: No    FAMILY HISTORY: Family History  Problem Relation Age of Onset   Heart disease Mother    Hyperlipidemia  Mother    Hypertension Mother    Pneumonia Sister    Schizophrenia Sister    Breast cancer Maternal Aunt    Breast cancer Paternal Aunt 57   Breast cancer Paternal Grandmother 61    ALLERGIES:  is allergic to tetracycline.  MEDICATIONS:  Current Outpatient Medications  Medication Sig Dispense Refill   lansoprazole (PREVACID) 30 MG capsule Take 1 capsule (30 mg total) by mouth daily. 90 capsule 0   valACYclovir (VALTREX) 500 MG tablet TAKE 1 TABLET EVERY DAY 90 tablet 0   No current facility-administered medications for this visit.    REVIEW OF SYSTEMS:   Constitutional: Denies fevers, chills or abnormal night sweats Breast:  Denies any palpable lumps or discharge All other systems were reviewed with the patient and are negative.  PHYSICAL EXAMINATION: ECOG PERFORMANCE STATUS: 1 - Symptomatic but completely ambulatory  Vitals:   05/29/22 1610  BP: (!) 137/90  Pulse: 72  Resp: 17  Temp: 97.7 F (36.5 C)  SpO2: 100%   Filed Weights   05/29/22 1610  Weight: 114 lb 11.2 oz (52 kg)    GENERAL:alert, no distress and comfortable    LABORATORY DATA:  I have reviewed the data as listed Lab Results  Component Value Date   WBC 5.1 12/02/2015   HGB 13.0 12/02/2015   HCT 37.2 12/02/2015   MCV 85 12/02/2015   PLT 251 12/02/2015   Lab Results  Component Value Date   NA 143 12/02/2015   K 4.4 12/02/2015   CL 104 12/02/2015   CO2 24 12/02/2015    RADIOGRAPHIC STUDIES: I have personally reviewed the radiological reports and agreed with the findings in the report.  ASSESSMENT AND PLAN:  Malignant neoplasm of lower-outer quadrant of right breast of female, estrogen receptor positive ([place]) 04/21/2022: Screening mammogram detected right breast mass measuring 0.9 cm (workup performed at [facility]) biopsy revealed grade 2 IDC with DCIS ER 100%, PR 100%, HER2 1+: Negative  Pathology and radiology counseling:Discussed with the patient, the details of pathology including the type  of breast cancer,the clinical staging, the significance of ER, PR and HER-2/neu receptors and the implications for treatment. After reviewing the pathology in detail, we proceeded to discuss the different treatment options between surgery, radiation, chemotherapy, antiestrogen therapies.  Recommendations: 1. Breast conserving surgery followed by 2. Oncotype DX testing to determine if chemotherapy would be of any benefit followed by 3. Adjuvant radiation therapy followed by 4. Adjuvant antiestrogen therapy  Oncotype counseling: I discussed Oncotype DX test. I explained to the patient that this is a 21 gene panel to evaluate patient tumors DNA to calculate recurrence score. This would help determine whether patient has high risk or low risk breast cancer. She understands that if her tumor  was found to be high risk, she would benefit from systemic chemotherapy. If low risk, no need of chemotherapy.  Pain in the tailbone: She attributes this to injection that she received before her breast biopsy.  I do not see any record of that.  This was done at [facility].  Return to clinic after surgery to discuss final pathology report and then determine if Oncotype DX testing will need to be sent.   All questions were answered. The patient knows to call the clinic with any problems, questions or concerns.    Harriette Ohara, MD 05/29/22

## 2022-05-30 ENCOUNTER — Encounter: Payer: Self-pay | Admitting: *Deleted

## 2022-05-30 ENCOUNTER — Telehealth: Payer: Self-pay | Admitting: *Deleted

## 2022-05-30 NOTE — Telephone Encounter (Signed)
Left message for a return phone call about appointments.

## 2022-05-30 NOTE — Telephone Encounter (Signed)
Spoke with patient about coming in tomorrow 1/17 to see Dr. Marlou Starks, Dr. Isidore Moos, and Inez Catalina. Informed patient to arrive at 130 at the cancer center. Discussed navigation resources.

## 2022-05-30 NOTE — Progress Notes (Signed)
Radiation Oncology         [phone]) [phone] ________________________________  Initial Outpatient Consultation  Name: Christine Ayala MRN: [id number]  Date: 05/31/2022  DOB: 1955-10-02  DG:LOVF, Christine Anis, MD  Christine Kussmaul, MD   REFERRING PHYSICIAN: Autumn Messing III, MD  DIAGNOSIS:    ICD-10-CM   1. Malignant neoplasm of lower-outer quadrant of right breast of female, estrogen receptor positive ([place])  C50.511    Z17.0        Cancer Staging  Malignant neoplasm of lower-outer quadrant of right breast of female, estrogen receptor positive ([place]) Staging form: Breast, AJCC 8th Edition - Clinical stage from 05/31/2022: Stage IA (cT1b, cN0, cM0, G2, ER+, PR+, HER2-) - Signed by Eppie Gibson, MD on 05/31/2022 Stage prefix: Initial diagnosis Histologic grading system: 3 grade system   Stage IA Right Breast LOQ, Invasive mammary carcinoma with mucinous features, ER+ / PR+ / Her2- (by IHC), Grade 2  CHIEF COMPLAINT: Here to discuss management of right breast cancer  HISTORY OF PRESENT ILLNESS::Christine Ayala is a 67 y.o. female who initially presented for a routine screening mammogram on 03/11/21 which showed a possible abnormality in the right breast. No symptoms, if any, were reported at that time. This prompted a diagnostic right breast mammogram and right breast ultrasound on 05/26/21 which showed a likely benign 3 x 6 mm mass in the deep posterior right breast anterior to the pectoralis muscle. This was only appreciated mammographically as no sonographic correlate was appreciated.   In recent history, the patient presented for a follow-up bilateral diagnostic mammogram and right breast ultrasound on 04/21/22 which revealed a an irregular and suspicious high density mass in the posterior 6 o'clock right breast measuring 9 x 8 x 7 mm. Once again, this was only appreciated mammographically as no sonographic correlate was appreciated. ([place] also showed no abnormal appearing right axillary lymph nodes).    Biopsy of the right breast mass on date of 05/18/22 showed grade 2 invasive mammary carcinoma with mucinous features measuring 7 mm in the greatest linear extent of the sample, along with a focus suspicious for DCIS.  ER status: 100% positive and PR status 100% positive, both with strong staining intensity, Her2 status negative by IHC; Grade 2. Of note: diagnostic comment notes that the biopsy specimen analyzed consists of almost exclusively invasive mammary carcinoma with mucinous features. If the biopsy were to be representative of the tumor as a whole, the diagnosis would be classified as mucinous carcinoma. Based on this fact, an excisional biopsy may be warranted for further classification.   She anticipates Oncotype testing after breast conserving surgery.  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 Surrounded by supportive family today.  Non smoker.  PREVIOUS RADIATION THERAPY: No  PAST MEDICAL HISTORY:  has a past medical history of Anemia, Depression, Family history of breast cancer, GERD (gastroesophageal reflux disease), Headache, Herpes genitalis, and PONV (postoperative nausea and vomiting).    PAST SURGICAL HISTORY: Past Surgical History:  Procedure Laterality Date   CESAREAN SECTION  1978   COLONOSCOPY WITH PROPOFOL N/A 05/14/2015   Procedure: COLONOSCOPY WITH PROPOFOL;  Surgeon: Lucilla Lame, MD;  Location: [facility];  Service: Endoscopy;  Laterality: N/A;   FOOT SURGERY     WRIST SURGERY      FAMILY HISTORY: family history includes Breast cancer in her maternal aunt; Breast cancer (age of onset: 59) in her paternal grandmother; Breast cancer (age  of onset: 25) in her paternal aunt; Heart disease in her mother; Hyperlipidemia in her mother; Hypertension in her mother; Pneumonia in her sister; Schizophrenia in her sister.  SOCIAL HISTORY:  reports that she has quit smoking. She has never used smokeless  tobacco. She reports that she does not drink alcohol and does not use drugs.  ALLERGIES: Tetracycline  MEDICATIONS:  Current Outpatient Medications  Medication Sig Dispense Refill   lansoprazole (PREVACID) 30 MG capsule Take 1 capsule (30 mg total) by mouth daily. 90 capsule 0   valACYclovir (VALTREX) 500 MG tablet TAKE 1 TABLET EVERY DAY 90 tablet 0   No current facility-administered medications for this encounter.    REVIEW OF SYSTEMS: As above in HPI.   PHYSICAL EXAM:  vitals were not taken for this visit.   General: Alert and oriented, in no acute distress HEENT: Head is normocephalic. Extraocular movements are intact.  Heart: Regular in rate and rhythm with no murmurs, rubs, or gallops. Chest: Clear to auscultation bilaterally, with no rhonchi, wheezes, or rales. Abdomen: Soft, nontender, nondistended, with no rigidity or guarding. Extremities: No cyanosis or edema. Skin: No concerning lesions. Musculoskeletal: symmetric strength and muscle tone throughout. Neurologic: Cranial nerves II through XII are grossly intact. No obvious focalities. Speech is fluent. Coordination is intact. Psychiat no palpable masses appreciated in the breasts or axillae bilaterally .    ECOG = 0  0 - Asymptomatic (Fully active, able to carry on all predisease activities without restriction)  1 - Symptomatic but completely ambulatory (Restricted in physically strenuous activity but ambulatory and able to carry out work of a light or sedentary nature. For example, light housework, office work)  2 - Symptomatic, <50% in bed during the day (Ambulatory and capable of all self care but unable to carry out any work activities. Up and about more than 50% of waking hours)  3 - Symptomatic, >50% in bed, but not bedbound (Capable of only limited self-care, confined to bed or chair 50% or more of waking hours)  4 - Bedbound (Completely disabled. Cannot carry on any self-care. Totally confined to bed or  chair)  5 - Death   Eustace Pen MM, Creech RH, Tormey DC, et al. [phone]). "Toxicity and response criteria of the [facility]". [place] Oncol. 5 (6): 649-55   LABORATORY DATA:  Lab Results  Component Value Date   WBC 5.1 12/02/2015   HGB 13.0 12/02/2015   HCT 37.2 12/02/2015   MCV 85 12/02/2015   PLT 251 12/02/2015   CMP     Component Value Date/Time   NA 143 12/02/2015 0836   NA 138 01/09/2013 1751   K 4.4 12/02/2015 0836   K 4.0 01/09/2013 1751   CL 104 12/02/2015 0836   CL 105 01/09/2013 1751   CO2 24 12/02/2015 0836   CO2 29 01/09/2013 1751   GLUCOSE 90 12/02/2015 0836   GLUCOSE 91 01/09/2013 1751   BUN 14 12/02/2015 0836   BUN 15 01/09/2013 1751   CREATININE 0.75 12/02/2015 0836   CREATININE 0.75 01/09/2013 1751   CALCIUM 9.4 12/02/2015 0836   CALCIUM 9.1 01/09/2013 1751   PROT 6.4 12/02/2015 0836   PROT 7.0 01/09/2013 1751   ALBUMIN 4.3 12/02/2015 0836   ALBUMIN 3.8 01/09/2013 1751   AST 20 12/02/2015 0836   AST 28 01/09/2013 1751   ALT 19 12/02/2015 0836   ALT 33 01/09/2013 1751   ALKPHOS 65 12/02/2015 0836   ALKPHOS 80 01/09/2013 1751  BILITOT 0.3 12/02/2015 0836   BILITOT 0.2 01/09/2013 1751   GFRNONAA 89 12/02/2015 0836   GFRNONAA >60 01/09/2013 1751   GFRAA 102 12/02/2015 0836   GFRAA >60 01/09/2013 1751         RADIOGRAPHY:  as above    IMPRESSION/PLAN: This is a delightful 67 yo patient with Stage I ER+ right breast cancer.   It was a pleasure meeting the patient today. We discussed the risks, benefits, and side effects of radiotherapy. I recommend radiotherapy to the right breast to reduce her risk of locoregional recurrence by 2/3.  We discussed that radiation would take approximately 3-4 weeks to complete and that I would give the patient a few weeks to heal following surgery before starting treatment planning.  If chemotherapy were to be given, this would precede radiotherapy. We spoke about acute effects including skin  irritation and fatigue as well as much less common late effects including internal organ injury or irritation. We spoke about the latest technology that is used to minimize the risk of late effects for patients undergoing radiotherapy to the breast or chest wall. No guarantees of treatment were given. The patient is enthusiastic about proceeding with treatment. I look forward to participating in the patient's care.  I will await her referral back to me for postoperative follow-up and eventual CT simulation/treatment planning.  I asked the breat navigators to refer her to genetics, and they will try to get her in w/ the counselor today while patient in town.  On date of service, in total, I spent 40 minutes on this encounter. Patient was seen in person.   __________________________________________   Eppie Gibson, MD  This document serves as a record of services personally performed by Eppie Gibson, MD. It was created on her behalf by Roney Mans, a trained medical scribe. The creation of this record is based on the scribe's personal observations and the provider's statements to them. This document has been checked and approved by the attending provider.

## 2022-05-31 ENCOUNTER — Encounter: Payer: Self-pay | Admitting: Radiation Oncology

## 2022-05-31 ENCOUNTER — Other Ambulatory Visit: Payer: Self-pay

## 2022-05-31 ENCOUNTER — Inpatient Hospital Stay (HOSPITAL_BASED_OUTPATIENT_CLINIC_OR_DEPARTMENT_OTHER): Payer: BC Managed Care – PPO | Admitting: Genetic Counselor

## 2022-05-31 ENCOUNTER — Telehealth: Payer: Self-pay | Admitting: *Deleted

## 2022-05-31 ENCOUNTER — Encounter: Payer: Self-pay | Admitting: *Deleted

## 2022-05-31 ENCOUNTER — Ambulatory Visit: Payer: Self-pay | Admitting: General Surgery

## 2022-05-31 ENCOUNTER — Other Ambulatory Visit: Payer: Self-pay | Admitting: Genetic Counselor

## 2022-05-31 ENCOUNTER — Other Ambulatory Visit: Payer: Self-pay | Admitting: *Deleted

## 2022-05-31 ENCOUNTER — Inpatient Hospital Stay: Payer: BC Managed Care – PPO | Attending: Hematology and Oncology

## 2022-05-31 ENCOUNTER — Ambulatory Visit: Payer: BC Managed Care – PPO | Attending: General Surgery | Admitting: Physical Therapy

## 2022-05-31 ENCOUNTER — Ambulatory Visit
Admission: RE | Admit: 2022-05-31 | Discharge: 2022-05-31 | Disposition: A | Discharge status: Home / Self Care | Payer: BC Managed Care – PPO | Source: Ambulatory Visit | Attending: Radiation Oncology | Admitting: Radiation Oncology

## 2022-05-31 ENCOUNTER — Encounter: Payer: Self-pay | Admitting: General Practice

## 2022-05-31 ENCOUNTER — Encounter: Payer: Self-pay | Admitting: Physical Therapy

## 2022-05-31 DIAGNOSIS — Z170 Estrogen receptor positive status [ER+]: Secondary | ICD-10-CM

## 2022-05-31 DIAGNOSIS — Z803 Family history of malignant neoplasm of breast: Secondary | ICD-10-CM

## 2022-05-31 DIAGNOSIS — Z87891 Personal history of nicotine dependence: Secondary | ICD-10-CM | POA: Diagnosis not present

## 2022-05-31 DIAGNOSIS — C50511 Malignant neoplasm of lower-outer quadrant of right female breast: Secondary | ICD-10-CM | POA: Diagnosis present

## 2022-05-31 DIAGNOSIS — R293 Abnormal posture: Secondary | ICD-10-CM | POA: Diagnosis present

## 2022-05-31 DIAGNOSIS — Z1379 Encounter for other screening for genetic and chromosomal anomalies: Secondary | ICD-10-CM

## 2022-05-31 DIAGNOSIS — Z806 Family history of leukemia: Secondary | ICD-10-CM

## 2022-05-31 LAB — GENETIC SCREENING ORDER

## 2022-05-31 MED ORDER — KETOROLAC TROMETHAMINE 15 MG/ML IJ SOLN: 15.0000 mg | Freq: Once | INTRAMUSCULAR | Status: AC

## 2022-05-31 NOTE — Progress Notes (Signed)
[place] Spiritual Care Note  Met Christine Ayala and Christine Ayala as an addition to Breast Multidisciplinary Clinic. Christine Ayala reports that she is grateful for the very supportive team she has met today.  Provided pastoral presence, normalization of feelings, and introduction to [employer] support programming. Caregivers' potential to participate in programming with patients was particularly interesting to the family.  Christine Ayala is aware of ongoing Spiritual Care availability. We plan to follow up by phone in ca two weeks for a pastoral check-in.   [place], [place], [facility] Pager [phone] Voicemail [phone]

## 2022-05-31 NOTE — Telephone Encounter (Signed)
[employer] 2021-05 - Specimen Collection Study to Evaluate Biomarkers in Subjects with Cancer    Patient Christine Ayala was identified by Dr. Lindi Adie as a potential candidate for the above listed study.  This Clinical Research Nurse called  Christine Ayala, [id number], to discuss participation in the above listed research study.   Informed patient of purpose of study along with potential risks.  Informed patient it is completely voluntary. Patient stated she is interested in participating but is unable to come into clinic any earlier today to enroll on the study.  Patient agreed for research nurse to provide her a copy of the consent form today and then follow up for research appointment/lab on another day if she still wants to participate. Patient understands if she wants to participate in this study then consent and blood draw is required prior to surgery or any cancer treatment. Thanked patient for her time and look forward to meeting her this afternoon. Christine Ayala, BSN, RN, [place] Nurse II [phone] 05/31/2022 10:06 AM

## 2022-05-31 NOTE — Therapy (Signed)
OUTPATIENT PHYSICAL THERAPY BREAST CANCER BASELINE EVALUATION   Patient Name: Christine Ayala MRN: [id number] DOB:May 26, 1955, 67 y.o., female Today's Date: 05/31/2022  END OF SESSION:  PT End of Session - 05/31/22 1426     Visit Number 1    Number of Visits 2    Date for PT Re-Evaluation 07/26/22    PT Start Time 1343    PT Stop Time 1413   Also saw pt from 1435 to 1445 for a total of 40 minutes   PT Time Calculation (min) 30 min    Activity Tolerance Patient tolerated treatment well    Behavior During Therapy [facility] for tasks assessed/performed             Past Medical History:  Diagnosis Date   Anemia    Depression    Family history of breast cancer    genetic testing letter sent 2/19   GERD (gastroesophageal reflux disease)    Headache    migraines/sinus   Herpes genitalis    PONV (postoperative nausea and vomiting)    Past Surgical History:  Procedure Laterality Date   CESAREAN SECTION  1978   COLONOSCOPY WITH PROPOFOL N/A 05/14/2015   Procedure: COLONOSCOPY WITH PROPOFOL;  Surgeon: Lucilla Lame, MD;  Location: [place];  Service: Endoscopy;  Laterality: N/A;   FOOT SURGERY     WRIST SURGERY     Patient Active Problem List   Diagnosis Date Noted   Malignant neoplasm of lower-outer quadrant of right breast of female, estrogen receptor positive ([place]) 05/29/2022   Flu-like symptoms 05/12/2016   Well woman exam (no gynecological exam) 10/26/2015   Persistent dry cough 08/31/2015   History of herpes simplex infection 07/27/2015   Special screening for malignant neoplasms, colon    Acute non-recurrent ethmoidal sinusitis 04/26/2015   Screening for colon cancer 04/26/2015   Panic disorder without agoraphobia 11/10/2014   Acid reflux 11/10/2014   Herpes 11/10/2014   Menopausal symptom 11/10/2014   Screening examination for poliomyelitis 11/10/2014   Calculus of kidney 11/10/2014    REFERRING PROVIDER: Dr. Autumn Messing  REFERRING DIAG: Right breast  cancer   THERAPY DIAG:  Malignant neoplasm of lower-outer quadrant of right breast of female, estrogen receptor positive ([place])  Abnormal posture  Rationale for Evaluation and Treatment: Rehabilitation  ONSET DATE: 04/21/2022  SUBJECTIVE:                                                                                                                                                                                           SUBJECTIVE STATEMENT: Patient reports she is here today to be seen by her medical team  for her newly diagnosed right breast cancer.   PERTINENT HISTORY:  Patient was diagnosed on 04/21/2022 with right grade 2 invasive ductal carcinoma breast cancer. It measures 9 mm and is located in the lower outer quadrant. It is ER/PR positive and HER2 negative.   PATIENT GOALS:   reduce lymphedema risk and learn post op HEP.   PAIN:  Are you having pain? Yes: NPRS scale: varies/10 Pain location: across lower back Pain description: sharp Aggravating factors: Trying to get up from sitting Relieving factors: Unknown; pain present since she got a gluteal injection at [facility] 05/18/2022; it is unknown what was injected.  PRECAUTIONS: Active CA   HAND DOMINANCE: right  WEIGHT BEARING RESTRICTIONS: No  FALLS:  Has patient fallen in last 6 months? No  LIVING ENVIRONMENT: Patient lives with: her husband Lives in: House/apartment Has following equipment at home: None  OCCUPATION: Full time accountant  LEISURE: She does not exercise  PRIOR LEVEL OF FUNCTION: Independent   OBJECTIVE:  COGNITION: Overall cognitive status: Within functional limits for tasks assessed    POSTURE:  Forward head and rounded shoulders posture  UPPER EXTREMITY AROM/PROM:  A/PROM RIGHT   eval   Shoulder extension 46  Shoulder flexion 139  Shoulder abduction 156  Shoulder internal rotation 75  Shoulder external rotation 90    (Blank rows = not tested)  A/PROM LEFT   eval  Shoulder  extension 70  Shoulder flexion 127  Shoulder abduction 154  Shoulder internal rotation 69  Shoulder external rotation 80    (Blank rows = not tested)  CERVICAL AROM: All within normal limits  UPPER EXTREMITY STRENGTH: WNL  LYMPHEDEMA ASSESSMENTS:   LANDMARK RIGHT   eval  10 cm proximal to olecranon process 22  Olecranon process 22.2  10 cm proximal to ulnar styloid process 16.8  Just proximal to ulnar styloid process 13.7  Across hand at thumb web space 18  At base of 2nd digit 5.6  (Blank rows = not tested)  LANDMARK LEFT   eval  10 cm proximal to olecranon process 22  Olecranon process 21.6  10 cm proximal to ulnar styloid process 17  Just proximal to ulnar styloid process 13.8  Across hand at thumb web space 17.6  At base of 2nd digit 5.3  (Blank rows = not tested)  L-DEX LYMPHEDEMA SCREENING:  The patient was assessed using the L-Dex machine today to produce a lymphedema index baseline score. The patient will be reassessed on a regular basis (typically every 3 months) to obtain new L-Dex scores. If the score is > 6.5 points away from his/her baseline score indicating onset of subclinical lymphedema, it will be recommended to wear a compression garment for 4 weeks, 12 hours per day and then be reassessed. If the score continues to be > 6.5 points from baseline at reassessment, we will initiate lymphedema treatment. Assessing in this manner has a 95% rate of preventing clinically significant lymphedema.   L-DEX FLOWSHEETS - 05/31/22 1400       L-DEX LYMPHEDEMA SCREENING   Measurement Type Unilateral    L-DEX MEASUREMENT EXTREMITY Upper Extremity    POSITION  Standing    DOMINANT SIDE Right    At Risk Side Right    BASELINE SCORE (UNILATERAL) 4.1             QUICK DASH SURVEY:  Katina Dung - 05/31/22 0001     Open a tight or new jar No difficulty    Do heavy household chores (wash  walls, wash floors) No difficulty    Carry a shopping bag or briefcase No  difficulty    Wash your back No difficulty    Use a knife to cut food No difficulty    Recreational activities in which you take some force or impact through your arm, shoulder, or hand (golf, hammering, tennis) No difficulty    During the past week, to what extent has your arm, shoulder or hand problem interfered with your normal social activities with family, friends, neighbors, or groups? Not at all    During the past week, to what extent has your arm, shoulder or hand problem limited your work or other regular daily activities Not at all    Arm, shoulder, or hand pain. None    Tingling (pins and needles) in your arm, shoulder, or hand None    Difficulty Sleeping No difficulty    DASH Score 0 %              PATIENT EDUCATION:  Education details: Lymphedema risk reduction and post op shoulder/posture HEP Person educated: Patient Education method: Explanation, Demonstration, Handout Education comprehension: Patient verbalized understanding and returned demonstration  HOME EXERCISE PROGRAM: Patient was instructed today in a home exercise program today for post op shoulder range of motion. These included active assist shoulder flexion in sitting, scapular retraction, wall walking with shoulder abduction, and hands behind head external rotation.  She was encouraged to do these twice a day, holding 3 seconds and repeating 5 times when permitted by her physician.   ASSESSMENT:  CLINICAL IMPRESSION: Patient was diagnosed on 04/21/2022 with right grade 2 invasive ductal carcinoma breast cancer. It measures 9 mm and is located in the lower outer quadrant. It is ER/PR positive and HER2 negative. Her multidisciplinary medical team met prior to her assessments to determine a recommended treatment plan. She is planning to have a right lumpectomy and sentinel node biopsy followed by Oncotype testing, radiation, and anti-estrogen therapy. She will benefit from a post op PT reassessment to determine  needs and from L-Dex screens every 3 months for 2 years to detect subclinical lymphedema.  Pt will benefit from skilled therapeutic intervention to improve on the following deficits: Decreased knowledge of precautions, impaired UE functional use, pain, decreased ROM, postural dysfunction.   PT treatment/interventions: ADL/self-care home management, pt/family education, therapeutic exercise  REHAB POTENTIAL: Excellent  CLINICAL DECISION MAKING: Stable/uncomplicated  EVALUATION COMPLEXITY: Low   GOALS: Goals reviewed with patient? YES  LONG TERM GOALS: (STG=LTG)    Name Target Date Goal status  1 Pt will be able to verbalize understanding of pertinent lymphedema risk reduction practices relevant to her dx specifically related to skin care.  Baseline:  No knowledge 05/31/2022 Achieved at eval  2 Pt will be able to return demo and/or verbalize understanding of the post op HEP related to regaining shoulder ROM. Baseline:  No knowledge 05/31/2022 Achieved at eval  3 Pt will be able to verbalize understanding of the importance of attending the post op After Breast CA Class for further lymphedema risk reduction education and therapeutic exercise.  Baseline:  No knowledge 05/31/2022 Achieved at eval  4 Pt will demo she has regained full shoulder ROM and function post operatively compared to baselines.  Baseline: See objective measurements taken today. 07/26/2022     PLAN:  PT FREQUENCY/DURATION: EVAL and 1 follow up appointment.   PLAN FOR NEXT SESSION: will reassess 3-4 weeks post op to determine needs.   Patient will follow up at  outpatient cancer rehab 3-4 weeks following surgery.  If the patient requires physical therapy at that time, a specific plan will be dictated and sent to the referring physician for approval. The patient was educated today on appropriate basic range of motion exercises to begin post operatively and the importance of attending the After Breast Cancer class following  surgery.  Patient was educated today on lymphedema risk reduction practices as it pertains to recommendations that will benefit the patient immediately following surgery.  She verbalized good understanding.    Physical Therapy Information for After Breast Cancer Surgery/Treatment:  Lymphedema is a swelling condition that you may be at risk for in your arm if you have lymph nodes removed from the armpit area.  After a sentinel node biopsy, the risk is approximately 5-9% and is higher after an axillary node dissection.  There is treatment available for this condition and it is not life-threatening.  Contact your physician or physical therapist with concerns. You may begin the 4 shoulder/posture exercises (see additional sheet) when permitted by your physician (typically a week after surgery).  If you have drains, you may need to wait until those are removed before beginning range of motion exercises.  A general recommendation is to not lift your arms above shoulder height until drains are removed.  These exercises should be done to your tolerance and gently.  This is not a "no pain/no gain" type of recovery so listen to your body and stretch into the range of motion that you can tolerate, stopping if you have pain.  If you are having immediate reconstruction, ask your plastic surgeon about doing exercises as he or she may want you to wait. We encourage you to attend the free one time ABC (After Breast Cancer) class offered by [facility].  You will learn information related to lymphedema risk, prevention and treatment and additional exercises to regain mobility following surgery.  You can call [phone] for more information.  This is offered the 1st and 3rd Monday of each month.  You only attend the class one time. While undergoing any medical procedure or treatment, try to avoid blood pressure being taken or needle sticks from occurring on the arm on the side of cancer.   This  recommendation begins after surgery and continues for the rest of your life.  This may help reduce your risk of getting lymphedema (swelling in your arm). An excellent resource for those seeking information on lymphedema is the National Lymphedema Network's web site. It can be accessed at [place].org If you notice swelling in your hand, arm or breast at any time following surgery (even if it is many years from now), please contact your doctor or physical therapist to discuss this.  Lymphedema can be treated at any time but it is easier for you if it is treated early on.  If you feel like your shoulder motion is not returning to normal in a reasonable amount of time, please contact your surgeon or physical therapist.  [facility] [phone]. [address], [address], [place] [address]  ABC CLASS After Breast Cancer Class  After Breast Cancer Class is a specially designed exercise class to assist you in a safe recover after having breast cancer surgery.  In this class you will learn how to get back to full function whether your drains were just removed or if you had surgery a month ago.  This one-time class is held the 1st and 3rd  Monday of every month from 11:00 a.m. until 12:00 noon virtually.  This class is FREE and space is limited. For more information or to register for the next available class, call [phone].  Class Goals  Understand specific stretches to improve the flexibility of you chest and shoulder. Learn ways to safely strengthen your upper body and improve your posture. Understand the warning signs of infection and why you may be at risk for an arm infection. Learn about Lymphedema and prevention.  ** You do not attend this class until after surgery.  Drains must be removed to participate  Patient was instructed today in a home exercise program today for post op shoulder range of motion. These included active assist shoulder  flexion in sitting, scapular retraction, wall walking with shoulder abduction, and hands behind head external rotation.  She was encouraged to do these twice a day, holding 3 seconds and repeating 5 times when permitted by her physician.  Annia Friendly, [place] 05/31/22 2:52 PM

## 2022-05-31 NOTE — Research (Signed)
Trial:  [employer] 2021-05 - Specimen Collection Study to Evaluate Biomarkers in Subjects with Cancer   Patient Christine Ayala was identified by Dr. Lindi Adie as a potential candidate for the above listed study.  This Clinical Research Nurse met with Angelamarie Avakian, [id number], on 05/31/22 in a manner and location that ensures patient privacy to discuss participation in the above listed research study.  Patient is Accompanied by her husband and daughters .  A copy of the informed consent document with embedded HIPAA language was provided to the patient.  Patient reads, speaks, and understands [place].   Patient was provided with the business card of this Nurse and encouraged to contact the research team with any questions.  Approximately 5 minutes were spent with the patient reviewing the informed consent documents.  Patient was provided the option of taking informed consent documents home to review and was encouraged to review at their convenience with their support network, including other care providers. Patient took the consent documents home to review.  Patient agreed to research nurse calling her on Monday 06/05/22 to follow up and she agreed to call sooner if any questions or if she wants to enroll on study prior to next week.  Foye Spurling, BSN, RN, [place] Nurse II [phone] 05/31/2022 1:45 PM

## 2022-06-01 ENCOUNTER — Encounter: Payer: Self-pay | Admitting: Genetic Counselor

## 2022-06-01 DIAGNOSIS — Z803 Family history of malignant neoplasm of breast: Secondary | ICD-10-CM | POA: Insufficient documentation

## 2022-06-01 NOTE — Progress Notes (Signed)
REFERRING PROVIDER: Nicholas Lose, MD  PRIMARY PROVIDER:  Arnetha Courser, MD  PRIMARY REASON FOR VISIT:  1. Malignant neoplasm of lower-outer quadrant of right breast of female, estrogen receptor positive ([place])   2. Family history of breast cancer    HISTORY OF PRESENT ILLNESS:   Christine Ayala, a 67 y.o. female, was seen for a [place] cancer genetics consultation at the request of Dr. Lindi Adie due to a personal and family history of cancer.  Christine Ayala presents to clinic today to discuss the possibility of a hereditary predisposition to cancer, to discuss genetic testing, and to further clarify her future cancer risks, as well as potential cancer risks for family members.   In January 2024, at the age of 34, Christine Ayala was diagnosed with invasive ductal carcinoma of the right breast (ER/PR positive, HER2 negative).   CANCER HISTORY:  Oncology History  Malignant neoplasm of lower-outer quadrant of right breast of female, estrogen receptor positive ([name])  05/22/2022 Initial Diagnosis   04/21/2022: Screening mammogram detected right breast mass measuring 0.9 cm (workup performed at [facility]) biopsy revealed grade 2 IDC with DCIS ER 100%, PR 100%, HER2 1+: Negative   05/31/2022 Cancer Staging   Staging form: Breast, AJCC 8th Edition - Clinical stage from 05/31/2022: Stage IA (cT1b, cN0, cM0, G2, ER+, PR+, HER2-) - Signed by Eppie Gibson, MD on 05/31/2022 Stage prefix: Initial diagnosis Histologic grading system: 3 grade system     Past Medical History:  Diagnosis Date   Anemia    Depression    Family history of breast cancer    genetic testing letter sent 2/19   GERD (gastroesophageal reflux disease)    Headache    migraines/sinus   Herpes genitalis    PONV (postoperative nausea and vomiting)     Past Surgical History:  Procedure Laterality Date   CESAREAN SECTION  1978   COLONOSCOPY WITH PROPOFOL N/A 05/14/2015   Procedure: COLONOSCOPY WITH PROPOFOL;  Surgeon: Lucilla Lame, MD;   Location: [place];  Service: Endoscopy;  Laterality: N/A;   FOOT SURGERY     WRIST SURGERY      Social History   Socioeconomic History   Marital status: Married    Spouse name: Not on file   Number of children: Not on file   Years of education: Not on file   Highest education level: Not on file  Occupational History   Not on file  Tobacco Use   Smoking status: Former   Smokeless tobacco: Never   Tobacco comments:    quit 20 yrs ago  Vaping Use   Vaping Use: Never used  Substance and Sexual Activity   Alcohol use: No    Alcohol/week: 0.0 standard drinks of alcohol   Drug use: No   Sexual activity: Yes    Birth control/protection: None  Other Topics Concern   Not on file  Social History Narrative   Not on file   Social Determinants of Health   Financial Resource Strain: Not on file  Food Insecurity: Not on file  Transportation Needs: Not on file  Physical Activity: Insufficiently Active (06/19/2017)   Exercise Vital Sign    Days of Exercise per Week: 5 days    Minutes of Exercise per Session: 10 min  Stress: No Stress Concern Present (06/19/2017)   [place]    Feeling of Stress : Not at all  Social Connections: [place] (06/19/2017)   Social Connection and  Isolation Panel [NHANES]    Frequency of Communication with Friends and Family: More than three times a week    Frequency of Social Gatherings with Friends and Family: Once a week    Attends Religious Services: More than 4 times per year    Active Member of [employer] or Organizations: Yes    Attends Music therapist: More than 4 times per year    Marital Status: Married     FAMILY HISTORY:  We obtained a detailed, 4-generation family history.  Significant diagnoses are listed below: Family History  Problem Relation Age of Onset   Heart disease Mother    Hyperlipidemia Mother    Hypertension Mother    Pneumonia  Sister    Schizophrenia Sister    Breast cancer Maternal Aunt 30   Breast cancer Paternal Aunt 79   Breast cancer Paternal Aunt    Breast cancer Paternal Grandmother 33 - 3   Leukemia Paternal Grandfather    Leukemia Nephew 78      GENETIC COUNSELING ASSESSMENT: Christine Ayala is a 67 y.o. female with a personal and family history of cancer which is somewhat suggestive of a hereditary predisposition to cancer. We, therefore, discussed and recommended the following at today's visit.   DISCUSSION: We discussed that 5 - 10% of cancer is hereditary, with most cases of breast cancer associated with BRCA1/2.  There are other genes that can be associated with hereditary breast cancer syndromes.  We discussed that testing is beneficial for several reasons including knowing how to follow individuals after completing their treatment, identifying whether potential treatment options would be beneficial, and understanding if other family members could be at risk for cancer and allowing them to undergo genetic testing.   We reviewed the characteristics, features and inheritance patterns of hereditary cancer syndromes. We also discussed genetic testing, including the appropriate family members to test, the process of testing, insurance coverage and turn-around-time for results. We discussed the implications of a negative, positive, carrier and/or variant of uncertain significant result. We recommended [name] pursue genetic testing for a panel that includes genes associated with breast cancer.   [name]  was offered a common hereditary cancer panel (47 genes) and an expanded pan-cancer panel (70 genes). [name] was informed of the benefits and limitations of each panel, including that expanded pan-cancer panels contain genes that do not have clear management guidelines at this point in time.  We also discussed that as the number of genes included on a panel increases, the chances of variants of uncertain  significance increases. After considering the benefits and limitations of each gene panel, [name] elected to have [facility] Multi-Cancer Panel.  The Multi-Cancer + RNA Panel offered by [facility] includes sequencing and/or deletion/duplication analysis of the following 70 genes:  AIP*, ALK, APC*, ATM*, AXIN2*, BAP1*, BARD1*, BLM*, BMPR1A*, BRCA1*, BRCA2*, BRIP1*, CDC73*, CDH1*, CDK4, CDKN1B*, CDKN2A, CHEK2*, CTNNA1*, DICER1*, EPCAM (del/dup only), EGFR, FH*, FLCN*, GREM1 (promoter dup only), HOXB13, KIT, LZTR1, MAX*, MBD4, MEN1*, MET, MITF, MLH1*, MSH2*, MSH3*, MSH6*, MUTYH*, NF1*, NF2*, NTHL1*, PALB2*, PDGFRA, PMS2*, POLD1*, POLE*, POT1*, PRKAR1A*, PTCH1*, PTEN*, RAD51C*, RAD51D*, RB1*, RET, SDHA* (sequencing only), SDHAF2*, SDHB*, SDHC*, SDHD*, SMAD4*, SMARCA4*, SMARCB1*, SMARCE1*, STK11*, SUFU*, TMEM127*, TP53*, TSC1*, TSC2*, VHL*. RNA analysis is performed for * genes.  Based on Ms. Loren's personal and family history of cancer, she meets medical criteria for genetic testing. Despite that she meets criteria, she may still have an out of pocket cost. We discussed that if her  out of pocket cost for testing is over $100, the laboratory will call and confirm whether she wants to proceed with testing.  If the out of pocket cost of testing is less than $100 she will be billed by the genetic testing laboratory.   PLAN: After considering the risks, benefits, and limitations, Ms. Swinger provided informed consent to pursue genetic testing and the blood sample was sent to [facility] for analysis of the Multi-Cancer Panel. Results should be available within approximately 2-3 weeks' time, at which point they will be disclosed by telephone to Ms. Detienne, as will any additional recommendations warranted by these results. Ms. Criger will receive a summary of her genetic counseling visit and a copy of her results once available. This information will also be available in Epic.   Ms. Rhymes questions were answered  to her satisfaction today. Our contact information was provided should additional questions or concerns arise. Thank you for the referral and allowing [place] to share in the care of your patient.   Lucille Passy, MS, [facility] Genetic Counselor [email or web address] (P) [phone]  The patient was seen for a total of 20 minutes in face-to-face genetic counseling.  The patient brought her daughters. Drs. Lindi Adie and/or Burr Medico were available to discuss this case as needed.   _______________________________________________________________________ For Office Staff:  Number of people involved in session: 3 Was an Intern/ student involved with case: no

## 2022-06-05 ENCOUNTER — Telehealth: Payer: Self-pay | Admitting: *Deleted

## 2022-06-05 NOTE — Telephone Encounter (Signed)
[employer] 2021-05 - Specimen Collection Study to Evaluate Biomarkers in Subjects with Cancer   Called patient to follow up on the above study and see if she has any questions. Patient was busy at the moment and requested call back tomorrow.  Foye Spurling, BSN, RN, [place] Nurse II [phone] 06/05/2022 4:34 PM

## 2022-06-06 ENCOUNTER — Other Ambulatory Visit: Payer: Self-pay | Admitting: General Surgery

## 2022-06-06 ENCOUNTER — Encounter: Payer: Self-pay | Admitting: *Deleted

## 2022-06-06 ENCOUNTER — Telehealth: Payer: Self-pay | Admitting: *Deleted

## 2022-06-06 DIAGNOSIS — Z170 Estrogen receptor positive status [ER+]: Secondary | ICD-10-CM

## 2022-06-06 NOTE — Telephone Encounter (Signed)
"  Rhiana Gashi's daughter Steward Drone [phone]), calling to check status of caregiver FMLA forms for my intermittent leave for her surgery, physical therapy and radiation.  Copy given to surgeon and medical oncology.nurse Carlynn Spry." No form received per [employer].  Message left for navigator during call.  Georgiann Mohs to email FMLA paperwork to [email or web address].  Will email required [place] for patient signature if not received from navigator.  [facility] [employer] required for third party release of PHI. Forms process takes 7-10 business days (14 calendar) to complete forms.  Currently denies further questions or needs.

## 2022-06-06 NOTE — Telephone Encounter (Signed)
Called patient back today and reviewed the ES2021-05 blood and tissue collection study.  Reminded patient that participation is voluntary. Patient denies any questions and states she wants to participate. Patient states her surgery is scheduled for 06/28/22.  She agreed to come into clinic for research consent and lab appointments on Tuesday 06/13/22 at 3 pm. Gave patient my phone number and asked her to call back if any questions or if she needs to change this appointment. She verbalized understanding.  Foye Spurling, BSN, RN, [place] Nurse II [phone] 06/06/2022 9:55 AM

## 2022-06-08 ENCOUNTER — Encounter: Payer: Self-pay | Admitting: *Deleted

## 2022-06-12 ENCOUNTER — Telehealth: Payer: Self-pay | Admitting: Genetic Counselor

## 2022-06-12 ENCOUNTER — Encounter: Payer: Self-pay | Admitting: Genetic Counselor

## 2022-06-12 DIAGNOSIS — Z1379 Encounter for other screening for genetic and chromosomal anomalies: Secondary | ICD-10-CM | POA: Insufficient documentation

## 2022-06-12 NOTE — Telephone Encounter (Signed)
I contacted Christine Ayala to discuss her genetic testing results. No pathogenic variants were identified in the 70 genes analyzed. Of note, a variant of uncertain significance was identified in the MBD4 and POLE gene. Detailed clinic note to follow.  The test report has been scanned into EPIC and is located under the Molecular Pathology section of the Results Review tab.  A portion of the result report is included below for reference.   Christine Passy, MS, [facility] Genetic Counselor [email or web address] (P) [phone]

## 2022-06-13 ENCOUNTER — Inpatient Hospital Stay: Payer: BC Managed Care – PPO | Admitting: *Deleted

## 2022-06-13 ENCOUNTER — Inpatient Hospital Stay: Payer: BC Managed Care – PPO

## 2022-06-13 DIAGNOSIS — C50511 Malignant neoplasm of lower-outer quadrant of right female breast: Secondary | ICD-10-CM

## 2022-06-13 LAB — RESEARCH LABS

## 2022-06-13 NOTE — Research (Signed)
[employer] 2021-05 - Specimen Collection Study to Evaluate Biomarkers in Subjects with Cancer    06/13/2022  SECOND ELIGIBILITY:  This Coordinator has reviewed this patient's inclusion and exclusion criteria as a second review and confirms Karisma Meiser is eligible for study participation.  Patient may continue with enrollment.   Carol Ada, RT(R)(T) Clinical Research Coordinator

## 2022-06-13 NOTE — Research (Signed)
Trial Name:  [employer] 2021-05 - Specimen Collection Study to Evaluate Biomarkers in Subjects with Cancer    Patient Christine Ayala was identified by Dr. Lindi Adie as a potential candidate for the above listed study.  This Clinical Research Nurse met with Christine Ayala, [id number] on 06/13/22 in a manner and location that ensures patient privacy to discuss participation in the above listed research study.  Patient is Accompanied by her daughter.  Patient was previously provided with informed consent documents.  Patient has not yet read the informed consent documents and so documents were reviewed page by page today.  As outlined in the informed consent form, this Nurse and Jeri Lager discussed the purpose of the research study, the investigational nature of the study, study procedures and requirements for study participation, potential risks and benefits of study participation, as well as alternatives to participation.  This study is not blinded or double-blinded. The patient understands participation is voluntary and they may withdraw from study participation at any time.  This study does not involve randomization.  This study does not involve an investigational drug or device. This study does not involve a placebo. Patient understands enrollment is pending full eligibility review.   Confidentiality and how the patient's information will be used as part of study participation were discussed.  Patient was informed there is reimbursement provided for their time and effort spent on trial participation.  The patient is encouraged to discuss research study participation with their insurance provider to determine what costs they may incur as part of study participation, including research related injury.    All questions were answered to patient's satisfaction.  The informed consent with embedded HIPAA language was reviewed page by page.  The patient's mental and emotional status is appropriate to provide  informed consent, and the patient verbalizes an understanding of study participation.  Patient has agreed to participate in the above listed research study and has voluntarily signed the informed consent IRB approved version 28 May 2020, revised 13 Jun 2021 with embedded HIPAA language on 06/13/22 at 3:15PM.  The patient was provided with a copy of the signed informed consent form with embedded HIPAA language for their reference.  No study specific procedures were obtained prior to the signing of the informed consent document.  Approximately 20 minutes were spent with the patient reviewing the informed consent documents.  After obtaining informed consent patient, voluntarily signed the optional Release of Information form for use throughout trial participation.  Data Collection: Medical History:  High Blood Pressure  No Coronary Artery Disease No Lupus    No Rheumatoid Arthritis  No Diabetes   No      Lynch Syndrome  No  Is the patient currently taking a magnesium supplement?   No   Does the patient have a personal history of cancer (greater than 5 years ago)?  No  Does the patient have a family history of cancer in 1st or 2nd degree relatives? Yes If yes, Relationship(s) and Cancer type(s)? 1 grandmother and 3 aunts with Breast Cancer  Does the patient have history of alcohol consumption? Yes   If yes, current or former? former If former, year stopped? 2014 Number of years? 26 Drinks per week? 0 (drank very occasionally on special occasions "several times per year")  Does the patient have history of cigarette, cigar, pipe, or chewing tobacco use?  Yes  If yes, current for former? former If yes, type (Cigarette, cigar, pipe, and/or chewing tobacco)? cigarettes   If former, year  stopped? 2006 Number of years? 32 Packs/number/containers per day?  1/2  Eligibility: Eligibility criteria reviewed with patient. This Nurse has reviewed this patient's inclusion and exclusion criteria and  confirmed patient is eligible for study participation. Eligibility confirmed by treating investigator, who also agrees that patient should proceed with enrollment. Patient will continue with enrollment.  Blood Collection: Research blood obtained by [facility] venipuncture. Patient tolerated well without any adverse events.  Gift Card: $50 gift card given to patient for her participation in this study.     Thanked patient for her participation in this study.  Foye Spurling, BSN, RN, [place] Nurse II [phone] 06/13/2022

## 2022-06-15 ENCOUNTER — Encounter: Payer: Self-pay | Admitting: General Practice

## 2022-06-15 NOTE — Progress Notes (Signed)
[place] Spiritual Care Note  Made [facility] follow-up call for pastoral check-in. Christine Ayala reports no needs at this time and is aware of ongoing [place] team/programming availability for patient and family support.   [place], [place], [facility] Pager [phone] Voicemail [phone]

## 2022-06-19 ENCOUNTER — Ambulatory Visit: Payer: Self-pay | Admitting: Genetic Counselor

## 2022-06-19 DIAGNOSIS — Z1379 Encounter for other screening for genetic and chromosomal anomalies: Secondary | ICD-10-CM

## 2022-06-19 NOTE — Progress Notes (Signed)
HPI:   Ms. Cupples was previously seen in the [facility] clinic due to a personal and family history of cancer and concerns regarding a hereditary predisposition to cancer. Please refer to our prior cancer genetics clinic note for more information regarding our discussion, assessment and recommendations, at the time. Ms. Nault recent genetic test results were disclosed to her, as were recommendations warranted by these results. These results and recommendations are discussed in more detail below.  CANCER HISTORY:  Oncology History  Malignant neoplasm of lower-outer quadrant of right breast of female, estrogen receptor positive ([place])  05/22/2022 Initial Diagnosis   04/21/2022: Screening mammogram detected right breast mass measuring 0.9 cm (workup performed at [facility]) biopsy revealed grade 2 IDC with DCIS ER 100%, PR 100%, HER2 1+: Negative   05/31/2022 Cancer Staging   Staging form: Breast, AJCC 8th Edition - Clinical stage from 05/31/2022: Stage IA (cT1b, cN0, cM0, G2, ER+, PR+, HER2-) - Signed by Eppie Gibson, MD on 05/31/2022 Stage prefix: Initial diagnosis Histologic grading system: 3 grade system    Genetic Testing   [facility] Multi-Cancer Panel+RNA was Negative. Of note, a variant of uncertain significance was detected in the MBD4 gene (c.336C>T (Silent)) and POLE gene ([id number]). Report date is 06/12/2022.  The Multi-Cancer + RNA Panel offered by [facility] includes sequencing and/or deletion/duplication analysis of the following 70 genes:  AIP*, ALK, APC*, ATM*, AXIN2*, BAP1*, BARD1*, BLM*, BMPR1A*, BRCA1*, BRCA2*, BRIP1*, CDC73*, CDH1*, CDK4, CDKN1B*, CDKN2A, CHEK2*, CTNNA1*, DICER1*, EPCAM (del/dup only), EGFR, FH*, FLCN*, GREM1 (promoter dup only), HOXB13, KIT, LZTR1, MAX*, MBD4, MEN1*, MET, MITF, MLH1*, MSH2*, MSH3*, MSH6*, MUTYH*, NF1*, NF2*, NTHL1*, PALB2*, PDGFRA, PMS2*, POLD1*, POLE*, POT1*, PRKAR1A*, PTCH1*, PTEN*, RAD51C*, RAD51D*, RB1*, RET, SDHA* (sequencing only),  SDHAF2*, SDHB*, SDHC*, SDHD*, SMAD4*, SMARCA4*, SMARCB1*, SMARCE1*, STK11*, SUFU*, TMEM127*, TP53*, TSC1*, TSC2*, VHL*. RNA analysis is performed for * genes.     FAMILY HISTORY:  We obtained a detailed, 4-generation family history.  Significant diagnoses are listed below:      Family History  Problem Relation Age of Onset   Heart disease Mother     Hyperlipidemia Mother     Hypertension Mother     Pneumonia Sister     Schizophrenia Sister     Breast cancer Maternal Aunt 51   Breast cancer Paternal Aunt 32   Breast cancer Paternal Aunt     Breast cancer Paternal Grandmother 85 - 87   Leukemia Paternal Grandfather     Leukemia Nephew 25          GENETIC TEST RESULTS:  The [facility] Multi-Cancer Panel found no pathogenic mutations.  The Multi-Cancer + RNA Panel offered by [facility] includes sequencing and/or deletion/duplication analysis of the following 70 genes:  AIP*, ALK, APC*, ATM*, AXIN2*, BAP1*, BARD1*, BLM*, BMPR1A*, BRCA1*, BRCA2*, BRIP1*, CDC73*, CDH1*, CDK4, CDKN1B*, CDKN2A, CHEK2*, CTNNA1*, DICER1*, EPCAM (del/dup only), EGFR, FH*, FLCN*, GREM1 (promoter dup only), HOXB13, KIT, LZTR1, MAX*, MBD4, MEN1*, MET, MITF, MLH1*, MSH2*, MSH3*, MSH6*, MUTYH*, NF1*, NF2*, NTHL1*, PALB2*, PDGFRA, PMS2*, POLD1*, POLE*, POT1*, PRKAR1A*, PTCH1*, PTEN*, RAD51C*, RAD51D*, RB1*, RET, SDHA* (sequencing only), SDHAF2*, SDHB*, SDHC*, SDHD*, SMAD4*, SMARCA4*, SMARCB1*, SMARCE1*, STK11*, SUFU*, TMEM127*, TP53*, TSC1*, TSC2*, VHL*. RNA analysis is performed for * genes.  The test report has been scanned into EPIC and is located under the Molecular Pathology section of the Results Review tab.  A portion of the result report is included below for reference. Genetic testing reported out on 06/12/2022.       Genetic testing  identified a variant of uncertain significance (VUS) in the MBD4 and POLE genes.  At this time, it is unknown if the variants are associated with an increased risk for cancer or if  they are benign, but most uncertain variants are reclassified to benign. It should not be used to make medical management decisions. With time, we suspect the laboratory will determine the significance of the variants, if any. If the laboratory reclassifies the variants, we will attempt to contact Ms. Heikes to discuss it further.   Even though a pathogenic variant was not identified, possible explanations for the cancer in the family may include: There may be no hereditary risk for cancer in the family. The cancers in Ms. Evora and/or her family may be due to other genetic or environmental factors. There may be a gene mutation in one of these genes that current testing methods cannot detect, but that chance is small. There could be another gene that has not yet been discovered, or that we have not yet tested, that is responsible for the cancer diagnoses in the family.  It is also possible there is a hereditary cause for the cancer in the family that Ms. Yankovich did not inherit.  Therefore, it is important to remain in touch with cancer genetics in the future so that we can continue to offer Ms. Yearwood the most up to date genetic testing.   ADDITIONAL GENETIC TESTING:  We discussed with Ms. Rishel that her genetic testing was fairly extensive.  If there are genes identified to increase cancer risk that can be analyzed in the future, we would be happy to discuss and coordinate this testing at that time.    CANCER SCREENING RECOMMENDATIONS:  Ms. Franta test result is considered negative (normal).  This means that we have not identified a hereditary cause for her personal and family history of cancer at this time.   An individual's cancer risk and medical management are not determined by genetic test results alone. Overall cancer risk assessment incorporates additional factors, including personal medical history, family history, and any available genetic information that may result in a personalized plan  for cancer prevention and surveillance. Therefore, it is recommended she continue to follow the cancer management and screening guidelines provided by her oncology and primary healthcare provider.  RECOMMENDATIONS FOR FAMILY MEMBERS:   Since she did not inherit a mutation in a cancer predisposition gene included on this panel, her children could not have inherited a mutation from her in one of these genes. Individuals in this family might be at some increased risk of developing cancer, over the general population risk, due to the family history of cancer. We recommend women in this family have a yearly mammogram beginning at age 20, or 34 years younger than the earliest onset of cancer, an annual clinical breast exam, and perform monthly breast self-exams. We do not recommend familial testing for the variants of uncertain significance (VUS).  FOLLOW-UP:  Cancer genetics is a rapidly advancing field and it is possible that new genetic tests will be appropriate for her and/or her family members in the future. We encouraged her to remain in contact with cancer genetics on an annual basis so we can update her personal and family histories and let her know of advances in cancer genetics that may benefit this family.   Our contact number was provided. Ms. Wahlert questions were answered to her satisfaction, and she knows she is welcome to call us at anytime with additional questions or  concerns.   Lucille Passy, MS, [facility] Genetic Counselor [email or web address] (P) [phone]

## 2022-06-20 ENCOUNTER — Encounter: Payer: Self-pay | Admitting: Genetic Counselor

## 2022-06-20 ENCOUNTER — Encounter (HOSPITAL_BASED_OUTPATIENT_CLINIC_OR_DEPARTMENT_OTHER): Payer: Self-pay | Admitting: General Surgery

## 2022-06-20 ENCOUNTER — Other Ambulatory Visit: Payer: Self-pay

## 2022-06-21 NOTE — Progress Notes (Signed)
Received FMLA forms from Daughter Ivin Booty. Patient does not have a treatment plan at this time. Information given regarding FMLA process. Daughter will bring forms back to office for intermittent FMLA once treatment schedule has been determined

## 2022-06-27 ENCOUNTER — Other Ambulatory Visit: Payer: Self-pay | Admitting: General Surgery

## 2022-06-27 ENCOUNTER — Ambulatory Visit
Admission: RE | Admit: 2022-06-27 | Discharge: 2022-06-27 | Disposition: A | Discharge status: Home / Self Care | Payer: BC Managed Care – PPO | Source: Ambulatory Visit | Attending: General Surgery | Admitting: General Surgery

## 2022-06-27 DIAGNOSIS — C50511 Malignant neoplasm of lower-outer quadrant of right female breast: Secondary | ICD-10-CM

## 2022-06-27 HISTORY — PX: BREAST BIOPSY: SHX20

## 2022-06-28 ENCOUNTER — Ambulatory Visit
Admission: RE | Admit: 2022-06-28 | Discharge: 2022-06-28 | Disposition: A | Discharge status: Home / Self Care | Payer: BC Managed Care – PPO | Source: Ambulatory Visit | Attending: General Surgery | Admitting: General Surgery

## 2022-06-28 ENCOUNTER — Encounter (HOSPITAL_BASED_OUTPATIENT_CLINIC_OR_DEPARTMENT_OTHER): Payer: Self-pay | Admitting: General Surgery

## 2022-06-28 ENCOUNTER — Ambulatory Visit (HOSPITAL_COMMUNITY)
Admission: RE | Admit: 2022-06-28 | Discharge: 2022-06-28 | Disposition: A | Discharge status: Home / Self Care | Payer: BC Managed Care – PPO | Source: Ambulatory Visit | Attending: General Surgery | Admitting: General Surgery

## 2022-06-28 ENCOUNTER — Ambulatory Visit (HOSPITAL_BASED_OUTPATIENT_CLINIC_OR_DEPARTMENT_OTHER): Payer: BC Managed Care – PPO | Admitting: Anesthesiology

## 2022-06-28 ENCOUNTER — Other Ambulatory Visit: Payer: Self-pay

## 2022-06-28 ENCOUNTER — Encounter (HOSPITAL_BASED_OUTPATIENT_CLINIC_OR_DEPARTMENT_OTHER)
Admission: RE | Disposition: A | Discharge status: Home / Self Care | Payer: Self-pay | Source: Home / Self Care | Attending: General Surgery

## 2022-06-28 ENCOUNTER — Ambulatory Visit (HOSPITAL_BASED_OUTPATIENT_CLINIC_OR_DEPARTMENT_OTHER)
Admission: RE | Admit: 2022-06-28 | Discharge: 2022-06-28 | Disposition: A | Discharge status: Home / Self Care | Payer: BC Managed Care – PPO | Attending: General Surgery | Admitting: General Surgery

## 2022-06-28 DIAGNOSIS — Z87891 Personal history of nicotine dependence: Secondary | ICD-10-CM | POA: Diagnosis not present

## 2022-06-28 DIAGNOSIS — C50511 Malignant neoplasm of lower-outer quadrant of right female breast: Secondary | ICD-10-CM | POA: Diagnosis present

## 2022-06-28 DIAGNOSIS — Z09 Encounter for follow-up examination after completed treatment for conditions other than malignant neoplasm: Secondary | ICD-10-CM | POA: Diagnosis not present

## 2022-06-28 DIAGNOSIS — Z170 Estrogen receptor positive status [ER+]: Secondary | ICD-10-CM | POA: Diagnosis not present

## 2022-06-28 DIAGNOSIS — Z803 Family history of malignant neoplasm of breast: Secondary | ICD-10-CM | POA: Diagnosis not present

## 2022-06-28 DIAGNOSIS — F418 Other specified anxiety disorders: Secondary | ICD-10-CM | POA: Insufficient documentation

## 2022-06-28 DIAGNOSIS — K219 Gastro-esophageal reflux disease without esophagitis: Secondary | ICD-10-CM | POA: Insufficient documentation

## 2022-06-28 DIAGNOSIS — Z01818 Encounter for other preprocedural examination: Secondary | ICD-10-CM

## 2022-06-28 DIAGNOSIS — C50919 Malignant neoplasm of unspecified site of unspecified female breast: Secondary | ICD-10-CM

## 2022-06-28 HISTORY — PX: BREAST LUMPECTOMY WITH RADIOACTIVE SEED AND SENTINEL LYMPH NODE BIOPSY: SHX6550

## 2022-06-28 HISTORY — DX: Malignant neoplasm of unspecified site of unspecified female breast: C50.919

## 2022-06-28 SURGERY — BREAST LUMPECTOMY WITH RADIOACTIVE SEED AND SENTINEL LYMPH NODE BIOPSY
Anesthesia: General | Site: Breast | Laterality: Right

## 2022-06-28 MED ORDER — GABAPENTIN 100 MG PO CAPS
100.0000 mg | ORAL_CAPSULE | ORAL | Status: AC
  Administered 2022-06-28: 100 mg via ORAL

## 2022-06-28 MED ORDER — PROPOFOL 500 MG/50ML IV EMUL
INTRAVENOUS | Status: AC
  Filled 2022-06-28: qty 50

## 2022-06-28 MED ORDER — ONDANSETRON HCL 4 MG/2ML IJ SOLN
INTRAMUSCULAR | Status: DC | PRN
  Administered 2022-06-28: 4 mg via INTRAVENOUS

## 2022-06-28 MED ORDER — FENTANYL CITRATE (PF) 100 MCG/2ML IJ SOLN
INTRAMUSCULAR | Status: AC
  Filled 2022-06-28: qty 2

## 2022-06-28 MED ORDER — OXYCODONE HCL 5 MG PO TABS: 5.0000 mg | ORAL_TABLET | Freq: Four times a day (QID) | ORAL | 0 refills | Status: DC | PRN

## 2022-06-28 MED ORDER — PROPOFOL 10 MG/ML IV BOLUS
INTRAVENOUS | Status: DC | PRN
  Administered 2022-06-28: 120 mg via INTRAVENOUS

## 2022-06-28 MED ORDER — DEXAMETHASONE SODIUM PHOSPHATE 4 MG/ML IJ SOLN
INTRAMUSCULAR | Status: DC | PRN
  Administered 2022-06-28: 4 mg via INTRAVENOUS

## 2022-06-28 MED ORDER — DEXMEDETOMIDINE HCL IN NACL 80 MCG/20ML IV SOLN
INTRAVENOUS | Status: AC
  Filled 2022-06-28: qty 20

## 2022-06-28 MED ORDER — BUPIVACAINE HCL (PF) 0.5 % IJ SOLN
INTRAMUSCULAR | Status: DC | PRN
  Administered 2022-06-28: 15 mL via PERINEURAL

## 2022-06-28 MED ORDER — LIDOCAINE HCL (CARDIAC) PF 100 MG/5ML IV SOSY
PREFILLED_SYRINGE | INTRAVENOUS | Status: DC | PRN
  Administered 2022-06-28: 20 mg via INTRAVENOUS

## 2022-06-28 MED ORDER — MIDAZOLAM HCL 2 MG/2ML IJ SOLN
1.0000 mg | Freq: Once | INTRAMUSCULAR | Status: AC
  Administered 2022-06-28: 1 mg via INTRAVENOUS

## 2022-06-28 MED ORDER — CEFAZOLIN SODIUM-DEXTROSE 2-4 GM/100ML-% IV SOLN
2.0000 g | INTRAVENOUS | Status: AC
  Administered 2022-06-28: 2 g via INTRAVENOUS

## 2022-06-28 MED ORDER — BUPIVACAINE HCL (PF) 0.25 % IJ SOLN
INTRAMUSCULAR | Status: AC
  Filled 2022-06-28: qty 30

## 2022-06-28 MED ORDER — CEFAZOLIN SODIUM-DEXTROSE 2-4 GM/100ML-% IV SOLN
INTRAVENOUS | Status: AC
  Filled 2022-06-28: qty 100

## 2022-06-28 MED ORDER — LACTATED RINGERS IV SOLN: INTRAVENOUS | Status: DC

## 2022-06-28 MED ORDER — CHLORHEXIDINE GLUCONATE CLOTH 2 % EX PADS: 6.0000 | MEDICATED_PAD | Freq: Once | CUTANEOUS | Status: DC

## 2022-06-28 MED ORDER — OXYCODONE HCL 5 MG PO TABS: 5.0000 mg | ORAL_TABLET | Freq: Once | ORAL | Status: DC | PRN

## 2022-06-28 MED ORDER — GABAPENTIN 100 MG PO CAPS
ORAL_CAPSULE | ORAL | Status: AC
  Filled 2022-06-28: qty 1

## 2022-06-28 MED ORDER — TECHNETIUM TC 99M TILMANOCEPT KIT
1.0000 | PACK | Freq: Once | INTRAVENOUS | Status: AC | PRN
  Administered 2022-06-28: 1 via INTRADERMAL

## 2022-06-28 MED ORDER — FENTANYL CITRATE (PF) 100 MCG/2ML IJ SOLN
50.0000 ug | Freq: Once | INTRAMUSCULAR | Status: AC
  Administered 2022-06-28: 50 ug via INTRAVENOUS

## 2022-06-28 MED ORDER — ACETAMINOPHEN 500 MG PO TABS
1000.0000 mg | ORAL_TABLET | ORAL | Status: AC
  Administered 2022-06-28: 1000 mg via ORAL

## 2022-06-28 MED ORDER — EPHEDRINE SULFATE (PRESSORS) 50 MG/ML IJ SOLN
INTRAMUSCULAR | Status: DC | PRN
  Administered 2022-06-28 (×3): 5 mg via INTRAVENOUS

## 2022-06-28 MED ORDER — FENTANYL CITRATE (PF) 100 MCG/2ML IJ SOLN
INTRAMUSCULAR | Status: DC | PRN
  Administered 2022-06-28: 50 ug via INTRAVENOUS
  Administered 2022-06-28: 25 ug via INTRAVENOUS

## 2022-06-28 MED ORDER — PROMETHAZINE HCL 25 MG/ML IJ SOLN: 6.2500 mg | INTRAMUSCULAR | Status: DC | PRN

## 2022-06-28 MED ORDER — OXYCODONE HCL 5 MG/5ML PO SOLN: 5.0000 mg | Freq: Once | ORAL | Status: DC | PRN

## 2022-06-28 MED ORDER — PROPOFOL 500 MG/50ML IV EMUL
INTRAVENOUS | Status: DC | PRN
  Administered 2022-06-28: 150 ug/kg/min via INTRAVENOUS

## 2022-06-28 MED ORDER — BUPIVACAINE HCL (PF) 0.25 % IJ SOLN
INTRAMUSCULAR | Status: DC | PRN
  Administered 2022-06-28: 20 mL

## 2022-06-28 MED ORDER — AMISULPRIDE (ANTIEMETIC) 5 MG/2ML IV SOLN: 10.0000 mg | Freq: Once | INTRAVENOUS | Status: DC | PRN

## 2022-06-28 MED ORDER — FENTANYL CITRATE (PF) 100 MCG/2ML IJ SOLN: 25.0000 ug | INTRAMUSCULAR | Status: DC | PRN

## 2022-06-28 MED ORDER — MIDAZOLAM HCL 2 MG/2ML IJ SOLN
INTRAMUSCULAR | Status: AC
  Filled 2022-06-28: qty 2

## 2022-06-28 MED ORDER — ACETAMINOPHEN 500 MG PO TABS
ORAL_TABLET | ORAL | Status: AC
  Filled 2022-06-28: qty 2

## 2022-06-28 MED ORDER — BUPIVACAINE LIPOSOME 1.3 % IJ SUSP
INTRAMUSCULAR | Status: DC | PRN
  Administered 2022-06-28: 10 mL via PERINEURAL

## 2022-06-28 SURGICAL SUPPLY — 43 items
ADH SKN CLS APL DERMABOND .7 (GAUZE/BANDAGES/DRESSINGS) ×1
APL PRP STRL LF DISP 70% ISPRP (MISCELLANEOUS) ×1
APPLIER CLIP 9.375 MED OPEN (MISCELLANEOUS) ×1
APR CLP MED 9.3 20 MLT OPN (MISCELLANEOUS) ×1
BLADE SURG 15 STRL LF DISP TIS (BLADE) ×1 IMPLANT
BLADE SURG 15 STRL SS (BLADE) ×1
CANISTER SUC SOCK COL 7IN (MISCELLANEOUS) IMPLANT
CANISTER SUCT 1200ML W/VALVE (MISCELLANEOUS) IMPLANT
CHLORAPREP W/TINT 26 (MISCELLANEOUS) ×1 IMPLANT
CLIP APPLIE 9.375 MED OPEN (MISCELLANEOUS) ×1 IMPLANT
COVER BACK TABLE 60X90IN (DRAPES) ×1 IMPLANT
COVER MAYO STAND STRL (DRAPES) ×1 IMPLANT
COVER PROBE CYLINDRICAL 5X96 (MISCELLANEOUS) ×1 IMPLANT
DERMABOND ADVANCED .7 DNX12 (GAUZE/BANDAGES/DRESSINGS) ×1 IMPLANT
DRAPE LAPAROSCOPIC ABDOMINAL (DRAPES) ×1 IMPLANT
DRAPE UTILITY XL STRL (DRAPES) ×1 IMPLANT
ELECT COATED BLADE 2.86 ST (ELECTRODE) ×1 IMPLANT
ELECT REM PT RETURN 9FT ADLT (ELECTROSURGICAL) ×1
ELECTRODE REM PT RTRN 9FT ADLT (ELECTROSURGICAL) ×1 IMPLANT
GLOVE BIO SURGEON STRL SZ7.5 (GLOVE) ×1 IMPLANT
GOWN STRL REUS W/ TWL LRG LVL3 (GOWN DISPOSABLE) ×2 IMPLANT
GOWN STRL REUS W/TWL LRG LVL3 (GOWN DISPOSABLE) ×2
ILLUMINATOR WAVEGUIDE N/F (MISCELLANEOUS) IMPLANT
KIT MARKER MARGIN INK (KITS) ×1 IMPLANT
LIGHT WAVEGUIDE WIDE FLAT (MISCELLANEOUS) IMPLANT
NDL HYPO 25X1 1.5 SAFETY (NEEDLE) ×1 IMPLANT
NDL SAFETY ECLIP 18X1.5 (MISCELLANEOUS) IMPLANT
NEEDLE HYPO 25X1 1.5 SAFETY (NEEDLE) ×1 IMPLANT
NS IRRIG 1000ML POUR BTL (IV SOLUTION) IMPLANT
PACK BASIN DAY SURGERY FS (CUSTOM PROCEDURE TRAY) ×1 IMPLANT
PENCIL SMOKE EVACUATOR (MISCELLANEOUS) ×1 IMPLANT
SLEEVE SCD COMPRESS KNEE MED (STOCKING) ×1 IMPLANT
SPIKE FLUID TRANSFER (MISCELLANEOUS) IMPLANT
SPONGE T-LAP 18X18 ~~LOC~~+RFID (SPONGE) ×1 IMPLANT
SUT MON AB 4-0 PC3 18 (SUTURE) ×2 IMPLANT
SUT SILK 2 0 SH (SUTURE) IMPLANT
SUT VICRYL 3-0 CR8 SH (SUTURE) ×1 IMPLANT
SYR CONTROL 10ML LL (SYRINGE) ×1 IMPLANT
TOWEL GREEN STERILE FF (TOWEL DISPOSABLE) ×1 IMPLANT
TRACER MAGTRACE VIAL (MISCELLANEOUS) IMPLANT
TRAY FAXITRON CT DISP (TRAY / TRAY PROCEDURE) ×1 IMPLANT
TUBE CONNECTING 20X1/4 (TUBING) IMPLANT
YANKAUER SUCT BULB TIP NO VENT (SUCTIONS) IMPLANT

## 2022-06-28 NOTE — Anesthesia Preprocedure Evaluation (Addendum)
Anesthesia Evaluation  Patient identified by MRN, date of birth, ID band Patient awake    Reviewed: Allergy & Precautions, NPO status , Patient's Chart, lab work & pertinent test results  History of Anesthesia Complications (+) PONV and history of anesthetic complications  Airway Mallampati: I  TM Distance: >3 FB Neck ROM: Full    Dental no notable dental hx. (+) Teeth Intact, Caps, Dental Advisory Given   Pulmonary former smoker   Pulmonary exam normal breath sounds clear to auscultation       Cardiovascular negative cardio ROS Normal cardiovascular exam Rhythm:Regular Rate:Normal     Neuro/Psych  Headaches PSYCHIATRIC DISORDERS Anxiety Depression    Panic attacks   GI/Hepatic Neg liver ROS,GERD  Medicated,,  Endo/Other  Right Breast Ca  Renal/GU Renal diseaseHx/o renal calculi  negative genitourinary   Musculoskeletal   Abdominal   Peds  Hematology  (+) Blood dyscrasia, anemia   Anesthesia Other Findings   Reproductive/Obstetrics HSV                             Anesthesia Physical Anesthesia Plan  ASA: 2  Anesthesia Plan: General   Post-op Pain Management: Regional block* and Minimal or no pain anticipated   Induction: Intravenous  PONV Risk Score and Plan: 4 or greater and Treatment may vary due to age or medical condition, Scopolamine patch - Pre-op, Midazolam, Dexamethasone, Ondansetron and TIVA  Airway Management Planned: LMA  Additional Equipment: None  Intra-op Plan:   Post-operative Plan: Extubation in OR  Informed Consent: I have reviewed the patients History and Physical, chart, labs and discussed the procedure including the risks, benefits and alternatives for the proposed anesthesia with the patient or authorized representative who has indicated his/her understanding and acceptance.     Dental advisory given  Plan Discussed with: Anesthesiologist and  CRNA  Anesthesia Plan Comments:         Anesthesia Quick Evaluation

## 2022-06-28 NOTE — Interval H&P Note (Signed)
History and Physical Interval Note:  06/28/2022 9:24 AM  Christine Ayala  has presented today for surgery, with the diagnosis of RIGHT BREAST CANCER.  The various methods of treatment have been discussed with the patient and family. After consideration of risks, benefits and other options for treatment, the patient has consented to  Procedure(s): RIGHT BREAST LUMPECTOMY WITH RADIOACTIVE SEED AND SENTINEL LYMPH NODE BIOPSY (Right) as a surgical intervention.  The patient's history has been reviewed, patient examined, no change in status, stable for surgery.  I have reviewed the patient's chart and labs.  Questions were answered to the patient's satisfaction.     Autumn Messing III

## 2022-06-28 NOTE — Transfer of Care (Signed)
Immediate Anesthesia Transfer of Care Note  Patient: Christine Ayala  Procedure(s) Performed: RIGHT BREAST LUMPECTOMY WITH RADIOACTIVE SEED AND SENTINEL LYMPH NODE BIOPSY (Right: Breast)  Patient Location: PACU  Anesthesia Type:GA combined with regional for post-op pain  Level of Consciousness: drowsy and patient cooperative  Airway & Oxygen Therapy: Patient Spontanous Breathing and Patient connected to face mask oxygen  Post-op Assessment: Report given to RN and Post -op Vital signs reviewed and stable  Post vital signs: Reviewed and stable  Last Vitals:  Vitals Value Taken Time  BP    Temp    Pulse 59 06/28/22 1234  Resp 21 06/28/22 1234  SpO2 99 % 06/28/22 1234  Vitals shown include unvalidated device data.  Last Pain:  Vitals:   06/28/22 0931  TempSrc: Oral  PainSc: 0-No pain      Patients Stated Pain Goal: 3 ([id number] [phone])  Complications: No notable events documented.

## 2022-06-28 NOTE — Discharge Instructions (Signed)
  Post Anesthesia Home Care Instructions  Activity: Get plenty of rest for the remainder of the day. A responsible individual must stay with you for 24 hours following the procedure.  For the next 24 hours, DO NOT: -Drive a car -Paediatric nurse -Drink alcoholic beverages -Take any medication unless instructed by your physician -Make any legal decisions or sign important papers.  Meals: Start with liquid foods such as gelatin or soup. Progress to regular foods as tolerated. Avoid greasy, spicy, heavy foods. If nausea and/or vomiting occur, drink only clear liquids until the nausea and/or vomiting subsides. Call your physician if vomiting continues.  Special Instructions/Symptoms: Your throat may feel dry or sore from the anesthesia or the breathing tube placed in your throat during surgery. If this causes discomfort, gargle with warm salt water. The discomfort should disappear within 24 hours.  If you had a scopolamine patch placed behind your ear for the management of post- operative nausea and/or vomiting:  1. The medication in the patch is effective for 72 hours, after which it should be removed.  Wrap patch in a tissue and discard in the trash. Wash hands thoroughly with soap and water. 2. You may remove the patch earlier than 72 hours if you experience unpleasant side effects which may include dry mouth, dizziness or visual disturbances. 3. Avoid touching the patch. Wash your hands with soap and water after contact with the patch.     No Tylenol until after 3:45pm today.

## 2022-06-28 NOTE — Progress Notes (Signed)
Assisted Dr. Royce Macadamia with right, pectoralis, ultrasound guided block. Side rails up, monitors on throughout procedure. See vital signs in flow sheet. Tolerated Procedure well.

## 2022-06-28 NOTE — Anesthesia Procedure Notes (Signed)
Procedure Name: LMA Insertion Date/Time: 06/28/2022 11:12 AM  Performed by: Tniyah Nakagawa, Ernesta Amble, CRNAPre-anesthesia Checklist: Patient identified, Emergency Drugs available, Suction available and Patient being monitored Patient Re-evaluated:Patient Re-evaluated prior to induction Oxygen Delivery Method: Circle system utilized Preoxygenation: Pre-oxygenation with 100% oxygen Induction Type: IV induction Ventilation: Mask ventilation without difficulty LMA: LMA inserted LMA Size: 4.0 Number of attempts: 1 Airway Equipment and Method: Bite block Placement Confirmation: positive ETCO2 Tube secured with: Tape Dental Injury: Teeth and Oropharynx as per pre-operative assessment

## 2022-06-28 NOTE — Anesthesia Procedure Notes (Signed)
Anesthesia Regional Block: Pectoralis block   Pre-Anesthetic Checklist: , timeout performed,  Correct Patient, Correct Site, Correct Laterality,  Correct Procedure, Correct Position, site marked,  Risks and benefits discussed,  Surgical consent,  Pre-op evaluation,  At surgeon's request and post-op pain management  Laterality: Right  Prep: chloraprep       Needles:  Injection technique: Single-shot  Needle Type: Echogenic Stimulator Needle     Needle Length: 10cm  Needle Gauge: 21   Needle insertion depth: 6 cm   Additional Needles:   Procedures:,,,, ultrasound used (permanent image in chart),,    Narrative:  Start time: 06/28/2022 9:49 AM End time: 06/28/2022 9:54 AM Injection made incrementally with aspirations every 5 mL.  Performed by: Personally  Anesthesiologist: Josephine Igo, MD  Additional Notes: Timeout performed. Patient sedated. Relevant anatomy ID'd using [place]. Incremental 2-45m injection of LA with frequent aspiration. Patient tolerated procedure well.

## 2022-06-28 NOTE — Anesthesia Postprocedure Evaluation (Signed)
Anesthesia Post Note  Patient: Christine Ayala  Procedure(s) Performed: RIGHT BREAST LUMPECTOMY WITH RADIOACTIVE SEED AND SENTINEL LYMPH NODE BIOPSY (Right: Breast)     Patient location during evaluation: PACU Anesthesia Type: General Level of consciousness: awake Pain management: pain level controlled Vital Signs Assessment: post-procedure vital signs reviewed and stable Respiratory status: spontaneous breathing, nonlabored ventilation and respiratory function stable Cardiovascular status: blood pressure returned to baseline and stable Postop Assessment: no apparent nausea or vomiting Anesthetic complications: no   No notable events documented.  Last Vitals:  Vitals:   06/28/22 1315 06/28/22 1330  BP: (!) 99/52 (!) 104/58  Pulse: (!) 45 (!) 50  Resp: 10 12  Temp:    SpO2: 100% 95%    Last Pain:  Vitals:   06/28/22 1330  TempSrc:   PainSc: 0-No pain                 Nilda Simmer

## 2022-06-28 NOTE — H&P (Signed)
REFERRING PHYSICIAN: Truitt Merle, MD  PROVIDER: Landry Corporal, MD  MRN: [id number] DOB: 1955/05/28 Subjective   Chief Complaint: Breast Cancer   History of Present Illness: Christine Ayala is a 67 y.o. female who is seen today as an office consultation for evaluation of Breast Cancer .   We are asked to see the patient in consultation by Dr. Lindi Adie we are asked to see the patient in consultation by Dr. Lindi Adie to evaluate her for a new right breast cancer. The patient is a 67 year old white female who recently went for a follow-up 23-monthmammogram. At that time she was found to have a 9 mm mass in the lower aspect of the right breast. The lymph nodes looked normal. The mass was biopsied and came back as an invasive breast cancer that was ER and PR positive and HER2 negative. [facility] She is otherwise in good health and does not smoke. She does have a family history of breast cancer in a paternal grandmother, 2 paternal aunts, and 1 maternal aunt  Review of Systems: A complete review of systems was obtained from the patient. I have reviewed this information and discussed as appropriate with the patient. See HPI as well for other ROS.  ROS   Medical History: Past Medical History:  Diagnosis Date  Anemia  Anxiety  GERD (gastroesophageal reflux disease)  History of cancer   Patient Active Problem List  Diagnosis  Anemia  Anxiety state  Esophageal reflux  Malignant neoplasm of lower-outer quadrant of right breast of female, estrogen receptor positive   Past Surgical History:  Procedure Laterality Date  CESAREAN SECTION N/A  1978  Foot Surgery    Allergies  Allergen Reactions  Tetracycline Nausea And Vomiting   Current Outpatient Medications on File Prior to Visit  Medication Sig Dispense Refill  ALPRAZolam (XANAX) 0.5 MG tablet Take 0.5 mg by mouth 2 (two) times daily as needed   No current facility-administered medications on file prior to visit.    History reviewed. No pertinent family history.   Social History   Tobacco Use  Smoking Status Former  Types: Cigarettes  Smokeless Tobacco Never  Tobacco Comments  Quit smoking 20 years ago per patient    Social History   Socioeconomic History  Marital status: Divorced  Tobacco Use  Smoking status: Former  Types: Cigarettes  Smokeless tobacco: Never  Tobacco comments:  Quit smoking 20 years ago per patient  Vaping Use  Vaping Use: Never used  Substance and Sexual Activity  Alcohol use: Not Currently  Drug use: Never   Objective:  There were no vitals filed for this visit.  There is no height or weight on file to calculate BMI.  Physical Exam Vitals reviewed.  Constitutional:  General: She is not in acute distress. Appearance: Normal appearance.  HENT:  Head: Normocephalic and atraumatic.  Right Ear: External ear normal.  Left Ear: External ear normal.  Nose: Nose normal.  Mouth/Throat:  Mouth: Mucous membranes are moist.  Pharynx: Oropharynx is clear.  Eyes:  General: No scleral icterus. Extraocular Movements: Extraocular movements intact.  Conjunctiva/sclera: Conjunctivae normal.  Pupils: Pupils are equal, round, and reactive to light.  Cardiovascular:  Rate and Rhythm: Normal rate and regular rhythm.  Pulses: Normal pulses.  Heart sounds: Normal heart sounds.  Pulmonary:  Effort: Pulmonary effort is normal. No respiratory distress.  Breath sounds: Normal breath sounds.  Abdominal:  General: Bowel sounds are normal.  Palpations: Abdomen is soft.  Tenderness: There  is no abdominal tenderness.  Musculoskeletal:  General: No swelling, tenderness or deformity. Normal range of motion.  Cervical back: Normal range of motion and neck supple.  Skin: General: Skin is warm and dry.  Coloration: Skin is not jaundiced.  Neurological:  General: No focal deficit present.  Mental Status: She is alert and oriented to person, place, and time.  Psychiatric:   Mood and Affect: Mood normal.  Behavior: Behavior normal.     Breast: There is no palpable mass in either breast. There is no palpable axillary, supraclavicular, or cervical lymphadenopathy.  Labs, Imaging and Diagnostic Testing:  Assessment and Plan:   Diagnoses and all orders for this visit:  Malignant neoplasm of lower-outer quadrant of right breast of female, estrogen receptor positive  - [facility] Case Posting Request; Future    The patient appears to have a 9 mm cancer in the lower aspect of the right breast with clinically negative nodes and all favorable markers. I have discussed with her in detail the different options for treatment and at this point she favors breast conservation which I feel is very reasonable. She is also a good candidate for sentinel node biopsy. I have discussed with her in detail the risks and benefits of the operation as well as some of the technical aspects including the use of a radioactive seed for localization and she understands and wishes to proceed. She will meet with medical and radiation oncology to discuss adjuvant therapy. We will proceed with surgical planning.

## 2022-06-28 NOTE — Op Note (Signed)
06/28/2022  12:29 PM  PATIENT:  Christine Ayala  67 y.o. female  PRE-OPERATIVE DIAGNOSIS:  RIGHT BREAST CANCER  POST-OPERATIVE DIAGNOSIS:  RIGHT BREAST CANCER  PROCEDURE:  Procedure(s): RIGHT BREAST LUMPECTOMY WITH RADIOACTIVE SEED LOCALIZATION AND DEEP RIGHT AXILLARY SENTINEL LYMPH NODE BIOPSY (Right)  SURGEON:  Surgeon(s) and Role:    * Jovita Kussmaul, MD - Primary  PHYSICIAN ASSISTANT:   ASSISTANTS: none   ANESTHESIA:   local and general  EBL:  minimal   BLOOD ADMINISTERED:none  DRAINS: none   LOCAL MEDICATIONS USED:  MARCAINE     SPECIMEN:  Source of Specimen:  right breast tissue with additional anterior margin and sentinel nodes x 2  DISPOSITION OF SPECIMEN:  PATHOLOGY  COUNTS:  YES  TOURNIQUET:  * No tourniquets in log *  DICTATION: .Dragon Dictation  After informed consent was obtained the patient was brought to the operating room and placed in the supine position on the operating table.  After adequate induction of general anesthesia the patient's right chest, breast, and axillary area were prepped with ChloraPrep, allowed to dry, and draped in usual sterile manner.  An appropriate timeout was performed.  Previously an I-125 seed was placed in the lower portion of the right breast to mark an area of invasive breast cancer.  Also earlier in the day the patient underwent injection of 1 mCi of technetium sulfur colloid in the subareolar position on the right.  The neoprobe was set to I-125 in the area of radioactivity was readily identified in the lower right breast.  The area around this was infiltrated with quarter percent Marcaine.  A inframammary fold incision was made with a 15 blade knife closest to the cancer.  The incision was carried through the skin and subcutaneous tissue sharply with the electrocautery.  Dissection was then carried down to the chest wall muscle.  Dissection was then carried out superiorly from here between the chest wall muscle and the breast  tissue.  Once the dissection was beyond the area of the cancer I then moved the dissection to the anterior plane between the breast tissue in the subcutaneous fat and skin.  Once this dissection was also well beyond the area of the cancer then I removed a circular portion of breast tissue sharply with the electrocautery around the radioactive seed while checking the area of radioactivity frequently.  Once the specimen was removed it was oriented with the appropriate paint colors.  A specimen radiograph was obtained that showed the clip and seed to be near the center of the specimen.  I did elect to take an additional anterior margin and this was marked appropriately and sent to pathology as well.  Hemostasis was achieved using the Bovie electrocautery.  The wound was irrigated with saline and infiltrated with more quarter percent Marcaine.  The deep layer of the incision was then closed with interrupted 3-0 Vicryl stitches.  The skin was then closed with a running 4-0 Monocryl subcuticular stitch.  Attention was then turned to the right axilla.  The neoprobe was set to technetium and an area of radioactivity was readily identified.  The area overlying this was infiltrated with quarter percent Marcaine.  A small transversely oriented incision was made with a 15 blade knife overlying the area of radioactivity.  The incision was carried through the skin and subcutaneous tissue sharply with the electrocautery until the deep right axillary space was entered.  Using the neoprobe to direct blunt hemostat dissection I was able to identify  2 lymph nodes with radioactive signal.  Each of these was excised sharply with the electrocautery and the surrounding small vessels and lymphatics were controlled with clips.  Ex vivo counts on these nodes ranged from 30-200.  No other hot or palpable nodes were identified in the right axilla.  Hemostasis was achieved using the Bovie electrocautery.  The deep layer of the incision was  closed with interrupted 3-0 Vicryl stitches.  The skin was then closed with a running 4-0 Monocryl subcuticular stitch.  Dermabond dressings were applied.  The patient tolerated the procedure well.  At the end of the case all needle sponge and instrument counts were correct.  The patient was then awakened and taken to recovery in stable condition.  PLAN OF CARE: Discharge to home after PACU  PATIENT DISPOSITION:  PACU - hemodynamically stable.   Delay start of Pharmacological VTE agent (>24hrs) due to surgical blood loss or risk of bleeding: not applicable

## 2022-06-29 ENCOUNTER — Encounter (HOSPITAL_BASED_OUTPATIENT_CLINIC_OR_DEPARTMENT_OTHER): Payer: Self-pay | Admitting: General Surgery

## 2022-06-29 LAB — SURGICAL PATHOLOGY

## 2022-06-29 NOTE — Progress Notes (Signed)
Left message stating courtesy call and if any questions or concerns please call the doctors office.  

## 2022-07-06 ENCOUNTER — Telehealth: Payer: Self-pay | Admitting: *Deleted

## 2022-07-06 ENCOUNTER — Encounter: Payer: Self-pay | Admitting: *Deleted

## 2022-07-06 NOTE — Progress Notes (Signed)
Patient Care Team: Arnetha Courser, MD as PCP - General (Family Medicine) Rockwell Germany, RN as Registered Nurse Mauro Kaufmann, RN as Oncology Nurse Navigator Nicholas Lose, MD as Consulting Physician (Hematology and Oncology) Jovita Kussmaul, MD as Consulting Physician (General Surgery) Eppie Gibson, MD as Attending Physician (Radiation Oncology)  DIAGNOSIS:  Encounter Diagnosis  Name Primary?   Malignant neoplasm of lower-outer quadrant of right breast of female, estrogen receptor positive ([place]) Yes    SUMMARY OF ONCOLOGIC HISTORY: Oncology History  Malignant neoplasm of lower-outer quadrant of right breast of female, estrogen receptor positive ([place])  05/22/2022 Initial Diagnosis   04/21/2022: Screening mammogram detected right breast mass measuring 0.9 cm (workup performed at [facility]) biopsy revealed grade 2 IDC with DCIS ER 100%, PR 100%, HER2 1+: Negative   05/31/2022 Cancer Staging   Staging form: Breast, AJCC 8th Edition - Clinical stage from 05/31/2022: Stage IA (cT1b, cN0, cM0, G2, ER+, PR+, HER2-) - Signed by Eppie Gibson, MD on 05/31/2022 Stage prefix: Initial diagnosis Histologic grading system: 3 grade system    Genetic Testing   [facility] Multi-Cancer Panel+RNA was Negative. 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 Report date is 06/12/2022.  The Multi-Cancer + RNA Panel offered by [facility] includes sequencing and/or deletion/duplication analysis of the following 70 genes:  AIP*, ALK, APC*, ATM*, AXIN2*, BAP1*, BARD1*, BLM*, BMPR1A*, BRCA1*, BRCA2*, BRIP1*, CDC73*, CDH1*, CDK4, CDKN1B*, CDKN2A, CHEK2*, CTNNA1*, DICER1*, EPCAM (del/dup only), EGFR, FH*, FLCN*, GREM1 (promoter dup only), HOXB13, KIT, LZTR1, MAX*, MBD4, MEN1*, MET, MITF, MLH1*, MSH2*, MSH3*, MSH6*, MUTYH*, NF1*, NF2*, NTHL1*, PALB2*, PDGFRA, PMS2*, POLD1*, POLE*, POT1*, PRKAR1A*, PTCH1*, PTEN*, RAD51C*, RAD51D*, RB1*, RET, SDHA*  (sequencing only), SDHAF2*, SDHB*, SDHC*, SDHD*, SMAD4*, SMARCA4*, SMARCB1*, SMARCE1*, STK11*, SUFU*, TMEM127*, TP53*, TSC1*, TSC2*, VHL*. RNA analysis is performed for * genes.   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 female is here because of recent diagnosis of right breast cancer. She presents to the clinic for a follow-up after surgery. She reports surgery went well. She denies any pain. She has some discomfort under the right axilla. She says she has she has a soft ball under the axilla, she says it has been hurting.    ALLERGIES:  is allergic to covid-19 (mrna) vaccine and tetracycline.  MEDICATIONS:  Current Outpatient Medications  Medication Sig Dispense Refill   ALPRAZolam (XANAX) 0.5 MG tablet Take by mouth.     lansoprazole (PREVACID) 30 MG capsule Take 1 capsule (30 mg total) by mouth daily. 90 capsule 0   oxyCODONE (ROXICODONE) 5 MG immediate release tablet Take 1 tablet (5 mg total) by mouth every 6 (six) hours as needed for severe pain. 10 tablet 0   promethazine-dextromethorphan (PROMETHAZINE-DM) 6.25-15 MG/5ML syrup Take by mouth.     valACYclovir (VALTREX) 500 MG tablet TAKE 1 TABLET EVERY DAY 90 tablet 0   No current facility-administered medications for this visit.    PHYSICAL EXAMINATION: ECOG PERFORMANCE STATUS: 1 - Symptomatic but completely ambulatory  Vitals:   07/10/22 1351  BP: (!) 121/57  Pulse: 83  Resp: 16  Temp: 98.6 F (37 C)  SpO2: 96%   Filed Weights   07/10/22 1351  Weight:  114 lb 3.2 oz (51.8 kg)      LABORATORY DATA:  I have reviewed the data as listed    Latest Ref Rng & Units 12/02/2015    8:36 AM 11/25/2014   11:50 AM 01/09/2013    5:51 PM  CMP  Glucose 65 - 99 mg/dL 90  84  91   BUN 6 - 24  mg/dL '14  11  15   '$ Creatinine 0.57 - 1.00 mg/dL 0.75  0.77  0.75   Sodium 134 - 144 mmol/L 143  145  138   Potassium 3.5 - 5.2 mmol/L 4.4  4.7  4.0   Chloride 96 - 106 mmol/L 104  104  105   CO2 18 - 29 mmol/L '24  25  29   '$ Calcium 8.7 - 10.2 mg/dL 9.4  9.9  9.1   Total Protein 6.0 - 8.5 g/dL 6.4  6.6  7.0   Total Bilirubin 0.0 - 1.2 mg/dL 0.3  <0.2  0.2   Alkaline Phos 39 - 117 IU/L 65  71  80   AST 0 - 40 IU/L '20  20  28   '$ ALT 0 - 32 IU/L 19  17  33     Lab Results  Component Value Date   WBC 5.1 12/02/2015   HGB 13.0 12/02/2015   HCT 37.2 12/02/2015   MCV 85 12/02/2015   PLT 251 12/02/2015   NEUTROABS 2.8 12/02/2015    ASSESSMENT & PLAN:  Malignant neoplasm of lower-outer quadrant of right breast of female, estrogen receptor positive ([place]) 06/28/2022:Right lumpectomy: 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 I discussed the margins as well as lymph node surgeries. We also discussed the final staging along with previously performed ER/PR and HER-2/neu testing.  Treatment Plan: Oncotype DX testing to determine if chemotherapy would be of any benefit followed by Adjuvant radiation therapy followed by Adjuvant antiestrogen therapy  Return to clinic based upon Oncotype DX test result     No orders of the defined types were placed in this encounter.  The patient has a good understanding of the overall plan. she agrees with it. she will call with any problems that may develop before the next visit here. Total time spent: 30 mins including face to face time and time spent for planning, charting and co-ordination of care   Harriette Ohara, MD 07/10/22    I Gardiner Coins am acting as a Education administrator for [employer]  I have reviewed the above documentation for  accuracy and completeness, and I agree with the above.

## 2022-07-06 NOTE — Telephone Encounter (Signed)
Ordered oncotype per Dr. Lindi Adie. Sent requisition to pathology and exact sciences.

## 2022-07-10 ENCOUNTER — Inpatient Hospital Stay: Payer: BC Managed Care – PPO | Attending: Hematology and Oncology | Admitting: Hematology and Oncology

## 2022-07-10 ENCOUNTER — Other Ambulatory Visit: Payer: Self-pay

## 2022-07-10 VITALS — BP 121/57 | HR 83 | Temp 98.6°F | Resp 16 | Wt 114.2 lb

## 2022-07-10 DIAGNOSIS — Z170 Estrogen receptor positive status [ER+]: Secondary | ICD-10-CM

## 2022-07-10 DIAGNOSIS — C50511 Malignant neoplasm of lower-outer quadrant of right female breast: Secondary | ICD-10-CM | POA: Diagnosis not present

## 2022-07-10 DIAGNOSIS — Z79899 Other long term (current) drug therapy: Secondary | ICD-10-CM | POA: Diagnosis not present

## 2022-07-10 NOTE — Assessment & Plan Note (Signed)
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 I discussed the margins as well as lymph node surgeries. We also discussed the final staging along with previously performed ER/PR and HER-2/neu testing.  Treatment Plan: Oncotype DX testing to determine if chemotherapy would be of any benefit followed by Adjuvant radiation therapy followed by Adjuvant antiestrogen therapy  Return to clinic based upon Oncotype DX test result

## 2022-07-17 ENCOUNTER — Encounter (HOSPITAL_COMMUNITY): Payer: Self-pay

## 2022-07-17 ENCOUNTER — Telehealth: Payer: Self-pay | Admitting: Radiation Oncology

## 2022-07-17 NOTE — Telephone Encounter (Signed)
3/4 @ 11:05 Abigail Butts, RN from [facility] called to let Dr. Isidore Moos know that patient preferred to be seen in [place] for all her treatment appointments closer to her home.

## 2022-07-24 NOTE — Therapy (Signed)
OUTPATIENT PHYSICAL THERAPY BREAST CANCER POST OP FOLLOW UP   Patient Name: Christine Ayala MRN: [id number] DOB:Mar 12, 1956, 67 y.o., female Today's Date: 07/25/2022  END OF SESSION:  PT End of Session - 07/25/22 1609     Visit Number 2    Number of Visits 2    Date for PT Re-Evaluation 07/26/22    PT Start Time 1609    PT Stop Time 1655    PT Time Calculation (min) 46 min    Activity Tolerance Patient tolerated treatment well    Behavior During Therapy [facility] for tasks assessed/performed             Past Medical History:  Diagnosis Date   Anemia    Depression    Family history of breast cancer    genetic testing letter sent 2/19   GERD (gastroesophageal reflux disease)    Headache    migraines/sinus   Herpes genitalis    PONV (postoperative nausea and vomiting)    Past Surgical History:  Procedure Laterality Date   BREAST BIOPSY  06/27/2022   MM RT RADIOACTIVE SEED LOC MAMMO GUIDE 06/27/2022 [facility] MAMMOGRAPHY   BREAST LUMPECTOMY WITH RADIOACTIVE SEED AND SENTINEL LYMPH NODE BIOPSY Right 06/28/2022   Procedure: RIGHT BREAST LUMPECTOMY WITH RADIOACTIVE SEED AND SENTINEL LYMPH NODE BIOPSY;  Surgeon: Jovita Kussmaul, MD;  Location: [place];  Service: General;  Laterality: Right;   CESAREAN SECTION  1978   COLONOSCOPY WITH PROPOFOL N/A 05/14/2015   Procedure: COLONOSCOPY WITH PROPOFOL;  Surgeon: Lucilla Lame, MD;  Location: [place];  Service: Endoscopy;  Laterality: N/A;   FOOT SURGERY     WRIST SURGERY     Patient Active Problem List   Diagnosis Date Noted   Genetic testing 06/12/2022   Family history of breast cancer 06/01/2022   Malignant neoplasm of lower-outer quadrant of right breast of female, estrogen receptor positive ([place]) 05/29/2022   Flu-like symptoms 05/12/2016   Well woman exam (no gynecological exam) 10/26/2015   Persistent dry cough 08/31/2015   History of herpes simplex infection 07/27/2015   Special screening for malignant  neoplasms, colon    Acute non-recurrent ethmoidal sinusitis 04/26/2015   Screening for colon cancer 04/26/2015   Panic disorder without agoraphobia 11/10/2014   Acid reflux 11/10/2014   Herpes 11/10/2014   Menopausal symptom 11/10/2014   Screening examination for poliomyelitis 11/10/2014   Calculus of kidney 11/10/2014      REFERRING PROVIDER: Dr. Autumn Messing  REFERRING DIAG: Right Breast Cancer  THERAPY DIAG:  Malignant neoplasm of lower-outer quadrant of right breast of female, estrogen receptor positive ([place])  Abnormal posture  Rationale for Evaluation and Treatment: Rehabilitation  ONSET DATE: 04/21/2022  SUBJECTIVE:  SUBJECTIVE STATEMENT: I have been doing the exercises and I don't feel like I have any restrictions there.  I am still a little sore in the axillary region. No complaints of breast pain or swelling  PERTINENT HISTORY:  Patient was diagnosed on 04/21/2022 with right grade 2 invasive ductal carcinoma breast cancer. It measures 9 mm and is located in the lower outer quadrant. It is ER/PR positive and HER2 negative. She had a right lumpectomy with SLNB on 06/28/2022. She will start radiation tomorrow.  PATIENT GOALS:  Reassess how my recovery is going related to arm function, pain, and swelling.  PAIN:  Are you having pain? No  PRECAUTIONS: Recent Surgery, right UE Lymphedema risk,   ACTIVITY LEVEL / LEISURE: walk dog 3 times a day, I painted my crown moulding   OBJECTIVE:   PATIENT SURVEYS:  QUICK DASH: 0%  OBSERVATIONS: Incisions well healed, right breast appears slightly larger but pt said it always has been, no enlarged pores noted, no cording  POSTURE:  Forward head, rounded shoulders  LYMPHEDEMA ASSESSMENT:   UPPER EXTREMITY AROM/PROM:   A/PROM RIGHT   eval    RIGHT 07/25/2022  Shoulder extension 46 65  Shoulder flexion 139 142  Shoulder abduction 156 160  Shoulder internal rotation 75 75  Shoulder external rotation 90 105                          (Blank rows = not tested)   A/PROM LEFT   eval  Shoulder extension 70  Shoulder flexion 127  Shoulder abduction 154  Shoulder internal rotation 69  Shoulder external rotation 80                          (Blank rows = not tested)   CERVICAL AROM: All within normal limits   UPPER EXTREMITY STRENGTH: WNL   LYMPHEDEMA ASSESSMENTS:    LANDMARK RIGHT   eval RIGHT 07/25/2022  10 cm proximal to olecranon process 22 21.7  Olecranon process 22.2 22.25  10 cm proximal to ulnar styloid process 16.8 16.7  Just proximal to ulnar styloid process 13.7 13.75  Across hand at thumb web space 18 18.0  At base of 2nd digit 5.6 5.0  (Blank rows = not tested)   LANDMARK LEFT   eval  10 cm proximal to olecranon process 22  Olecranon process 21.6  10 cm proximal to ulnar styloid process 17  Just proximal to ulnar styloid process 13.8  Across hand at thumb web space 17.6  At base of 2nd digit 5.3  (Blank rows = not tested)    Surgery type/Date: 06/26/2022 Right lumpectomy with SLNB Number of lymph nodes removed: 0/2 Current/past treatment (chemo, radiation, hormone therapy): pending radiation and anti-estrogens, awaiting oncotype Other symptoms:  Heaviness/tightness No Pain No Pitting edema No Infections No Decreased scar mobility Yes Stemmer sign No  PATIENT EDUCATION:  Education details: ABC class, SOZO, Scar Massage, Continue compression BRA and exercises through duration of radiation as posible Person educated: Patient Education method: Explanation, Demonstration, and Handouts Education comprehension: verbalized understanding  HOME EXERCISE PROGRAM: Reviewed previously given post op HEP.   ASSESSMENT:  CLINICAL IMPRESSION: Pt is s/p right breast lumpectomy with SLNB on 06/28/2022. Pt  has restored her shoulder ROM to WNL and there is no sign of lymphedema. Her incisions are healing nicely with no sign of infection. She is mildly tender in the axillary region, possibly from bra  riding up. Foam pads were made for axillary region and lower border and pt reported feeling much better. She will try to continue her compression bra and exercises throughout the duration of radiation as able. There are no further PT needs identified at present  Pt will benefit from skilled therapeutic intervention to improve on the following deficits: Decreased knowledge of precautions, impaired UE functional use, pain, decreased ROM, postural dysfunction.   PT treatment/interventions: ADL/Self care home management, Therapeutic exercises, Patient/Family education, Self Care, and scar mobilization   GOALS: Goals reviewed with patient? Yes  LONG TERM GOALS:  (STG=LTG)  GOALS Name Target Date  Goal status  1 Pt will demonstrate she has regained full shoulder ROM and function post operatively compared to baselines.  Baseline: 07/26/2022 MET  '2     3     4        '$ PLAN:  PT FREQUENCY/DURATION: No further needs identified at this time. Pt is doing well  PLAN FOR NEXT SESSION: Pt is discharged from formal PT. She knows to contact us with any questions or concerns   [facility]  [address]  [place] [address]  [phone]  After Breast Cancer Class It is recommended you attend the ABC class to be educated on lymphedema risk reduction. This class is free of charge and lasts for 1 hour. It is a 1-time class. You will need to download the Webex app either on your phone or computer. We will send you a link the night before or the morning of the class. You should be able to click on that link to join the class. This is not a confidential class. You don't have to turn your camera on, but other participants may be able to see your email address.  Scar massage You  can begin gentle scar massage to you incision sites. Gently place one hand on the incision and move the skin (without sliding on the skin) in various directions. Do this for a few minutes and then you can gently massage either coconut oil or vitamin E cream into the scars.  Compression garment You should continue wearing your compression bra until you feel like you no longer have swelling.  Home exercise Program Continue doing the exercises you were given until you feel like you can do them without feeling any tightness at the end.   Walking Program Studies show that 30 minutes of walking per day (fast enough to elevate your heart rate) can significantly reduce the risk of a cancer recurrence. If you can't walk due to other medical reasons, we encourage you to find another activity you could do (like a stationary bike or water exercise).  Posture After breast cancer surgery, people frequently sit with rounded shoulders posture because it puts their incisions on slack and feels better. If you sit like this and scar tissue forms in that position, you can become very tight and have pain sitting or standing with good posture. Try to be aware of your posture and sit and stand up tall to heal properly.  Follow up PT: It is recommended you return every 3 months for the first 3 years following surgery to be assessed on the SOZO machine for an L-Dex score. This helps prevent clinically significant lymphedema in 95% of patients. These follow up screens are 10 minute appointments that you are not billed for.  Claris Pong, PT 07/25/2022, 5:14 PM

## 2022-07-25 ENCOUNTER — Ambulatory Visit: Payer: BC Managed Care – PPO | Attending: General Surgery

## 2022-07-25 DIAGNOSIS — C50511 Malignant neoplasm of lower-outer quadrant of right female breast: Secondary | ICD-10-CM | POA: Insufficient documentation

## 2022-07-25 DIAGNOSIS — R293 Abnormal posture: Secondary | ICD-10-CM | POA: Diagnosis present

## 2022-07-25 DIAGNOSIS — Z170 Estrogen receptor positive status [ER+]: Secondary | ICD-10-CM | POA: Insufficient documentation

## 2022-07-25 NOTE — Patient Instructions (Signed)
     [facility]  [address], [address]  [place] [address]  [phone]  After Breast Cancer Class It is recommended you attend the ABC class to be educated on lymphedema risk reduction. This class is free of charge and lasts for 1 hour. It is a 1-time class. You will need to download the Webex app either on your phone or computer. We will send you a link the night before or the morning of the class. You should be able to click on that link to join the class. This is not a confidential class. You don't have to turn your camera on, but other participants may be able to see your email address.  Scar massage You can begin gentle scar massage to you incision sites. Gently place one hand on the incision and move the skin (without sliding on the skin) in various directions. Do this for a few minutes and then you can gently massage either coconut oil or vitamin E cream into the scars.  Compression garment You should continue wearing your compression bra until you feel like you no longer have swelling.  Home exercise Program Continue doing the exercises you were given until you feel like you can do them without feeling any tightness at the end.   Walking Program Studies show that 30 minutes of walking per day (fast enough to elevate your heart rate) can significantly reduce the risk of a cancer recurrence. If you can't walk due to other medical reasons, we encourage you to find another activity you could do (like a stationary bike or water exercise).  Posture After breast cancer surgery, people frequently sit with rounded shoulders posture because it puts their incisions on slack and feels better. If you sit like this and scar tissue forms in that position, you can become very tight and have pain sitting or standing with good posture. Try to be aware of your posture and sit and stand up tall to heal properly.  Follow up PT: It is recommended you return every 3 months for  the first 2 years following surgery to be assessed on the SOZO machine for an L-Dex score. This helps prevent clinically significant lymphedema in 95% of patients. These follow up screens are 10 minute appointments that you are not billed for.  

## 2022-07-26 ENCOUNTER — Ambulatory Visit
Admission: RE | Admit: 2022-07-26 | Discharge: 2022-07-26 | Disposition: A | Discharge status: Home / Self Care | Payer: BC Managed Care – PPO | Source: Ambulatory Visit | Attending: Radiation Oncology | Admitting: Radiation Oncology

## 2022-07-26 ENCOUNTER — Encounter: Payer: Self-pay | Admitting: Radiation Oncology

## 2022-07-26 VITALS — BP 119/66 | HR 67 | Temp 97.9°F | Resp 16 | Wt 114.0 lb

## 2022-07-26 DIAGNOSIS — C50511 Malignant neoplasm of lower-outer quadrant of right female breast: Secondary | ICD-10-CM

## 2022-07-26 NOTE — Consult Note (Signed)
NEW PATIENT EVALUATION  Name: Christine Ayala  MRN: [id number]  Date:   07/26/2022     DOB: 1956-03-16   This 67 y.o. female patient presents to the clinic for initial evaluation of stage Ia (pT1b N0 M0) ER/PR positive HER2 negative invasive mammary carcinoma of the right breast status post wide local excision.  REFERRING PHYSICIAN: Arnetha Courser, MD  CHIEF COMPLAINT:  Chief Complaint  Patient presents with   Breast Cancer    DIAGNOSIS: The encounter diagnosis was Malignant neoplasm of lower-outer quadrant of right breast of female, estrogen receptor positive ([place]).   PREVIOUS INVESTIGATIONS:  Mammogram and ultrasound reviewed Clinical notes reviewed Pathology report  HPI: Patient is a 67 year old female who presented with an abnormal mammogram of her right breast.  There was possible asymmetry in the right breast..  She went on to have a targeted biopsy which was positive for invasive mammary carcinoma.  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  Tumor was 1 x 0.9 x 0.7 cm.  Margins were clear at 1 cm.  2 sentinel lymph nodes were examined and negative for metastatic disease tumor was ER/PR positive HER2/neu not overexpressed.  She had Oncotype DX performed showing low benefit to systemic treatment.  She is seen today for radiation oncology opinion she is doing well she specifically denies breast tenderness cough or bone pain.  PLANNED TREATMENT REGIMEN: Hypofractionated right whole breast radiation  PAST MEDICAL HISTORY:  has a past medical history of Anemia, Depression, Family history of breast cancer, GERD (gastroesophageal reflux disease), Headache, Herpes genitalis, and PONV (postoperative nausea and vomiting).    PAST SURGICAL HISTORY:  Past Surgical History:  Procedure Laterality Date   BREAST BIOPSY  06/27/2022   MM RT RADIOACTIVE SEED LOC MAMMO GUIDE 06/27/2022 [facility] MAMMOGRAPHY    BREAST LUMPECTOMY WITH RADIOACTIVE SEED AND SENTINEL LYMPH NODE BIOPSY Right 06/28/2022   Procedure: RIGHT BREAST LUMPECTOMY WITH RADIOACTIVE SEED AND SENTINEL LYMPH NODE BIOPSY;  Surgeon: Jovita Kussmaul, MD;  Location: [place];  Service: General;  Laterality: Right;   CESAREAN SECTION  1978   COLONOSCOPY WITH PROPOFOL N/A 05/14/2015   Procedure: COLONOSCOPY WITH PROPOFOL;  Surgeon: Lucilla Lame, MD;  Location: [place];  Service: Endoscopy;  Laterality: N/A;   FOOT SURGERY     WRIST SURGERY      FAMILY HISTORY: family history includes Breast cancer in her paternal aunt; Breast cancer (age of onset: 31 - 56) in her paternal grandmother; Breast cancer (age of onset: 42) in her paternal aunt; Breast cancer (age of onset: 83) in her maternal aunt; Heart disease in her mother; Hyperlipidemia in her mother; Hypertension in her mother; Leukemia in her paternal grandfather; Leukemia (age of onset: 89) in her nephew; Pneumonia in her sister; Schizophrenia in her sister.  SOCIAL HISTORY:  reports that she has quit smoking. She has never used smokeless tobacco. She reports that she does not drink alcohol and does not use drugs.  ALLERGIES: Covid-19 (mrna) vaccine and Tetracycline  MEDICATIONS:  Current Outpatient Medications  Medication Sig Dispense Refill   ALPRAZolam (XANAX) 0.5 MG tablet Take by mouth.     lansoprazole (PREVACID) 30 MG capsule Take 1 capsule (30 mg total) by mouth daily. 90 capsule 0   No current facility-administered medications for this encounter.    ECOG PERFORMANCE STATUS:  0 - Asymptomatic  REVIEW OF SYSTEMS: Patient denies any weight loss, fatigue,  weakness, fever, chills or night sweats. Patient denies any loss of vision, blurred vision. Patient denies any ringing  of the ears or hearing loss. No irregular heartbeat. Patient denies heart murmur or history of fainting. Patient denies any chest pain or pain radiating to her upper extremities.  Patient denies any shortness of breath, difficulty breathing at night, cough or hemoptysis. Patient denies any swelling in the lower legs. Patient denies any nausea vomiting, vomiting of blood, or coffee ground material in the vomitus. Patient denies any stomach pain. Patient states has had normal bowel movements no significant constipation or diarrhea. Patient denies any dysuria, hematuria or significant nocturia. Patient denies any problems walking, swelling in the joints or loss of balance. Patient denies any skin changes, loss of hair or loss of weight. Patient denies any excessive worrying or anxiety or significant depression. Patient denies any problems with insomnia. Patient denies excessive thirst, polyuria, polydipsia. Patient denies any swollen glands, patient denies easy bruising or easy bleeding. Patient denies any recent infections, allergies or URI. Patient "s visual fields have not changed significantly in recent time.   PHYSICAL EXAM: BP 119/66 (BP Location: Left Arm, Patient Position: Sitting, Cuff Size: Small)   Pulse 67   Temp 97.9 F (36.6 C) (Tympanic)   Resp 16   Wt 114 lb (51.7 kg)   BMI 19.57 kg/m  She status post wide local excision of the right breast scar is well-healed no seroma is identified no dominant masses noted in either breast.  No axillary or supraclavicular adenopathy is identified.  Well-developed well-nourished patient in NAD. HEENT reveals PERLA, EOMI, discs not visualized.  Oral cavity is clear. No oral mucosal lesions are identified. Neck is clear without evidence of cervical or supraclavicular adenopathy. Lungs are clear to A&P. Cardiac examination is essentially unremarkable with regular rate and rhythm without murmur rub or thrill. Abdomen is benign with no organomegaly or masses noted. Motor sensory and DTR levels are equal and symmetric in the upper and lower extremities. Cranial nerves II through XII are grossly intact. Proprioception is intact. No  peripheral adenopathy or edema is identified. No motor or sensory levels are noted. Crude visual fields are within normal range.  LABORATORY DATA: Pathology reports reviewed    RADIOLOGY RESULTS: Mammogram and ultrasound reviewed compatible with above-stated findings   IMPRESSION: Stage Ia invasive mammary carcinoma with mucinous features of the right breast status post wide local excision and sentinel node biopsy ER/PR positive in 67 year old female  PLAN: At this time I recommended hypofractionated course of whole breast radiation over 3 weeks would also boost her scar another 1000 centigrade using a most likely photon therapy.  Risks and benefits of treatment occluding skin reaction fatigue alteration blood counts possible inclusion of superficial lung all were reviewed in detail with the patient.  She seems to comprehend my treatment plan well.  She also will benefit from endocrine therapy after completion of radiation.  I have personally set up and ordered CT simulation for next week.  I would like to take this opportunity to thank you for allowing me to participate in the care of your patient.Noreene Filbert, MD

## 2022-07-27 ENCOUNTER — Encounter: Payer: Self-pay | Admitting: *Deleted

## 2022-07-27 ENCOUNTER — Telehealth: Payer: Self-pay | Admitting: *Deleted

## 2022-07-27 NOTE — Telephone Encounter (Signed)
Received oncotype results of 11/3%. Patient is aware. She will be getting her xrt in [place].

## 2022-08-02 ENCOUNTER — Ambulatory Visit
Admission: RE | Admit: 2022-08-02 | Discharge: 2022-08-02 | Disposition: A | Discharge status: Home / Self Care | Payer: BC Managed Care – PPO | Source: Ambulatory Visit | Attending: Radiation Oncology | Admitting: Radiation Oncology

## 2022-08-02 DIAGNOSIS — Z510 Encounter for antineoplastic radiation therapy: Secondary | ICD-10-CM | POA: Diagnosis not present

## 2022-08-02 DIAGNOSIS — C50511 Malignant neoplasm of lower-outer quadrant of right female breast: Secondary | ICD-10-CM | POA: Diagnosis present

## 2022-08-04 ENCOUNTER — Other Ambulatory Visit: Payer: Self-pay | Admitting: *Deleted

## 2022-08-04 DIAGNOSIS — Z170 Estrogen receptor positive status [ER+]: Secondary | ICD-10-CM

## 2022-08-07 DIAGNOSIS — Z510 Encounter for antineoplastic radiation therapy: Secondary | ICD-10-CM | POA: Diagnosis not present

## 2022-08-08 ENCOUNTER — Encounter: Payer: Self-pay | Admitting: *Deleted

## 2022-08-09 ENCOUNTER — Ambulatory Visit: Admission: RE | Admit: 2022-08-09 | Payer: BC Managed Care – PPO | Source: Ambulatory Visit

## 2022-08-09 DIAGNOSIS — Z510 Encounter for antineoplastic radiation therapy: Secondary | ICD-10-CM | POA: Diagnosis not present

## 2022-08-10 ENCOUNTER — Other Ambulatory Visit: Payer: Self-pay

## 2022-08-10 ENCOUNTER — Ambulatory Visit
Admission: RE | Admit: 2022-08-10 | Discharge: 2022-08-10 | Disposition: A | Discharge status: Home / Self Care | Payer: BC Managed Care – PPO | Source: Ambulatory Visit | Attending: Radiation Oncology | Admitting: Radiation Oncology

## 2022-08-10 DIAGNOSIS — Z510 Encounter for antineoplastic radiation therapy: Secondary | ICD-10-CM | POA: Diagnosis not present

## 2022-08-10 LAB — RAD ONC ARIA SESSION SUMMARY
Course Elapsed Days: 0
Plan Fractions Treated to Date: 1
Plan Prescribed Dose Per Fraction: 2.66 Gy
Plan Total Fractions Prescribed: 16
Plan Total Prescribed Dose: 42.56 Gy
Reference Point Dosage Given to Date: 2.66 Gy
Reference Point Session Dosage Given: 2.66 Gy
Session Number: 1

## 2022-08-11 ENCOUNTER — Other Ambulatory Visit: Payer: Self-pay

## 2022-08-11 ENCOUNTER — Ambulatory Visit
Admission: RE | Admit: 2022-08-11 | Discharge: 2022-08-11 | Disposition: A | Discharge status: Home / Self Care | Payer: BC Managed Care – PPO | Source: Ambulatory Visit | Attending: Radiation Oncology | Admitting: Radiation Oncology

## 2022-08-11 DIAGNOSIS — Z510 Encounter for antineoplastic radiation therapy: Secondary | ICD-10-CM | POA: Diagnosis not present

## 2022-08-11 LAB — RAD ONC ARIA SESSION SUMMARY
Course Elapsed Days: 1
Plan Fractions Treated to Date: 2
Plan Prescribed Dose Per Fraction: 2.66 Gy
Plan Total Fractions Prescribed: 16
Plan Total Prescribed Dose: 42.56 Gy
Reference Point Dosage Given to Date: 5.32 Gy
Reference Point Session Dosage Given: 2.66 Gy
Session Number: 2

## 2022-08-14 ENCOUNTER — Ambulatory Visit
Admission: RE | Admit: 2022-08-14 | Discharge: 2022-08-14 | Disposition: A | Discharge status: Home / Self Care | Payer: BC Managed Care – PPO | Source: Ambulatory Visit | Attending: Radiation Oncology | Admitting: Radiation Oncology

## 2022-08-14 ENCOUNTER — Other Ambulatory Visit: Payer: Self-pay

## 2022-08-14 DIAGNOSIS — C50511 Malignant neoplasm of lower-outer quadrant of right female breast: Secondary | ICD-10-CM | POA: Insufficient documentation

## 2022-08-14 DIAGNOSIS — Z510 Encounter for antineoplastic radiation therapy: Secondary | ICD-10-CM | POA: Insufficient documentation

## 2022-08-14 LAB — RAD ONC ARIA SESSION SUMMARY
Course Elapsed Days: 4
Plan Fractions Treated to Date: 3
Plan Prescribed Dose Per Fraction: 2.66 Gy
Plan Total Fractions Prescribed: 16
Plan Total Prescribed Dose: 42.56 Gy
Reference Point Dosage Given to Date: 7.98 Gy
Reference Point Session Dosage Given: 2.66 Gy
Session Number: 3

## 2022-08-15 ENCOUNTER — Other Ambulatory Visit: Payer: Self-pay

## 2022-08-15 ENCOUNTER — Ambulatory Visit
Admission: RE | Admit: 2022-08-15 | Discharge: 2022-08-15 | Disposition: A | Discharge status: Home / Self Care | Payer: BC Managed Care – PPO | Source: Ambulatory Visit | Attending: Radiation Oncology | Admitting: Radiation Oncology

## 2022-08-15 DIAGNOSIS — Z510 Encounter for antineoplastic radiation therapy: Secondary | ICD-10-CM | POA: Diagnosis not present

## 2022-08-15 LAB — RAD ONC ARIA SESSION SUMMARY
Course Elapsed Days: 5
Plan Fractions Treated to Date: 4
Plan Prescribed Dose Per Fraction: 2.66 Gy
Plan Total Fractions Prescribed: 16
Plan Total Prescribed Dose: 42.56 Gy
Reference Point Dosage Given to Date: 10.64 Gy
Reference Point Session Dosage Given: 2.66 Gy
Session Number: 4

## 2022-08-16 ENCOUNTER — Ambulatory Visit
Admission: RE | Admit: 2022-08-16 | Discharge: 2022-08-16 | Disposition: A | Discharge status: Home / Self Care | Payer: BC Managed Care – PPO | Source: Ambulatory Visit | Attending: Radiation Oncology | Admitting: Radiation Oncology

## 2022-08-16 ENCOUNTER — Other Ambulatory Visit: Payer: Self-pay

## 2022-08-16 DIAGNOSIS — Z510 Encounter for antineoplastic radiation therapy: Secondary | ICD-10-CM | POA: Diagnosis not present

## 2022-08-16 LAB — RAD ONC ARIA SESSION SUMMARY
Course Elapsed Days: 6
Plan Fractions Treated to Date: 5
Plan Prescribed Dose Per Fraction: 2.66 Gy
Plan Total Fractions Prescribed: 16
Plan Total Prescribed Dose: 42.56 Gy
Reference Point Dosage Given to Date: 13.3 Gy
Reference Point Session Dosage Given: 2.66 Gy
Session Number: 5

## 2022-08-17 ENCOUNTER — Other Ambulatory Visit: Payer: Self-pay

## 2022-08-17 ENCOUNTER — Ambulatory Visit
Admission: RE | Admit: 2022-08-17 | Discharge: 2022-08-17 | Disposition: A | Discharge status: Home / Self Care | Payer: BC Managed Care – PPO | Source: Ambulatory Visit | Attending: Radiation Oncology | Admitting: Radiation Oncology

## 2022-08-17 ENCOUNTER — Telehealth: Payer: Self-pay | Admitting: Hematology and Oncology

## 2022-08-17 DIAGNOSIS — Z510 Encounter for antineoplastic radiation therapy: Secondary | ICD-10-CM | POA: Diagnosis not present

## 2022-08-17 LAB — RAD ONC ARIA SESSION SUMMARY
Course Elapsed Days: 7
Plan Fractions Treated to Date: 6
Plan Prescribed Dose Per Fraction: 2.66 Gy
Plan Total Fractions Prescribed: 16
Plan Total Prescribed Dose: 42.56 Gy
Reference Point Dosage Given to Date: 15.96 Gy
Reference Point Session Dosage Given: 2.66 Gy
Session Number: 6

## 2022-08-17 NOTE — Telephone Encounter (Signed)
Scheduled appointment per scheduling message. Left voicemail.  

## 2022-08-18 ENCOUNTER — Ambulatory Visit
Admission: RE | Admit: 2022-08-18 | Discharge: 2022-08-18 | Disposition: A | Discharge status: Home / Self Care | Payer: BC Managed Care – PPO | Source: Ambulatory Visit | Attending: Radiation Oncology | Admitting: Radiation Oncology

## 2022-08-18 ENCOUNTER — Other Ambulatory Visit: Payer: Self-pay

## 2022-08-18 DIAGNOSIS — Z510 Encounter for antineoplastic radiation therapy: Secondary | ICD-10-CM | POA: Diagnosis not present

## 2022-08-18 LAB — RAD ONC ARIA SESSION SUMMARY
Course Elapsed Days: 8
Plan Fractions Treated to Date: 7
Plan Prescribed Dose Per Fraction: 2.66 Gy
Plan Total Fractions Prescribed: 16
Plan Total Prescribed Dose: 42.56 Gy
Reference Point Dosage Given to Date: 18.62 Gy
Reference Point Session Dosage Given: 2.66 Gy
Session Number: 7

## 2022-08-21 ENCOUNTER — Other Ambulatory Visit: Payer: Self-pay

## 2022-08-21 ENCOUNTER — Ambulatory Visit
Admission: RE | Admit: 2022-08-21 | Discharge: 2022-08-21 | Disposition: A | Discharge status: Home / Self Care | Payer: BC Managed Care – PPO | Source: Ambulatory Visit | Attending: Radiation Oncology | Admitting: Radiation Oncology

## 2022-08-21 ENCOUNTER — Inpatient Hospital Stay: Payer: BC Managed Care – PPO | Attending: Hematology and Oncology

## 2022-08-21 DIAGNOSIS — Z510 Encounter for antineoplastic radiation therapy: Secondary | ICD-10-CM | POA: Diagnosis not present

## 2022-08-21 LAB — RAD ONC ARIA SESSION SUMMARY
Course Elapsed Days: 11
Plan Fractions Treated to Date: 8
Plan Prescribed Dose Per Fraction: 2.66 Gy
Plan Total Fractions Prescribed: 16
Plan Total Prescribed Dose: 42.56 Gy
Reference Point Dosage Given to Date: 21.28 Gy
Reference Point Session Dosage Given: 2.66 Gy
Session Number: 8

## 2022-08-22 ENCOUNTER — Ambulatory Visit
Admission: RE | Admit: 2022-08-22 | Discharge: 2022-08-22 | Disposition: A | Discharge status: Home / Self Care | Payer: BC Managed Care – PPO | Source: Ambulatory Visit | Attending: Radiation Oncology | Admitting: Radiation Oncology

## 2022-08-22 ENCOUNTER — Other Ambulatory Visit: Payer: Self-pay

## 2022-08-22 DIAGNOSIS — Z510 Encounter for antineoplastic radiation therapy: Secondary | ICD-10-CM | POA: Diagnosis not present

## 2022-08-22 LAB — RAD ONC ARIA SESSION SUMMARY
Course Elapsed Days: 12
Plan Fractions Treated to Date: 9
Plan Prescribed Dose Per Fraction: 2.66 Gy
Plan Total Fractions Prescribed: 16
Plan Total Prescribed Dose: 42.56 Gy
Reference Point Dosage Given to Date: 23.94 Gy
Reference Point Session Dosage Given: 2.66 Gy
Session Number: 9

## 2022-08-23 ENCOUNTER — Ambulatory Visit
Admission: RE | Admit: 2022-08-23 | Discharge: 2022-08-23 | Disposition: A | Discharge status: Home / Self Care | Payer: BC Managed Care – PPO | Source: Ambulatory Visit | Attending: Radiation Oncology | Admitting: Radiation Oncology

## 2022-08-23 ENCOUNTER — Other Ambulatory Visit: Payer: Self-pay

## 2022-08-23 DIAGNOSIS — Z510 Encounter for antineoplastic radiation therapy: Secondary | ICD-10-CM | POA: Diagnosis not present

## 2022-08-23 LAB — RAD ONC ARIA SESSION SUMMARY
Course Elapsed Days: 13
Plan Fractions Treated to Date: 10
Plan Prescribed Dose Per Fraction: 2.66 Gy
Plan Total Fractions Prescribed: 16
Plan Total Prescribed Dose: 42.56 Gy
Reference Point Dosage Given to Date: 26.6 Gy
Reference Point Session Dosage Given: 2.66 Gy
Session Number: 10

## 2022-08-24 ENCOUNTER — Other Ambulatory Visit: Payer: Self-pay

## 2022-08-24 ENCOUNTER — Encounter: Payer: Self-pay | Admitting: *Deleted

## 2022-08-24 ENCOUNTER — Ambulatory Visit
Admission: RE | Admit: 2022-08-24 | Discharge: 2022-08-24 | Disposition: A | Discharge status: Home / Self Care | Payer: BC Managed Care – PPO | Source: Ambulatory Visit | Attending: Radiation Oncology | Admitting: Radiation Oncology

## 2022-08-24 DIAGNOSIS — Z510 Encounter for antineoplastic radiation therapy: Secondary | ICD-10-CM | POA: Diagnosis not present

## 2022-08-24 DIAGNOSIS — C50511 Malignant neoplasm of lower-outer quadrant of right female breast: Secondary | ICD-10-CM

## 2022-08-24 LAB — RAD ONC ARIA SESSION SUMMARY
Course Elapsed Days: 14
Plan Fractions Treated to Date: 11
Plan Prescribed Dose Per Fraction: 2.66 Gy
Plan Total Fractions Prescribed: 16
Plan Total Prescribed Dose: 42.56 Gy
Reference Point Dosage Given to Date: 29.26 Gy
Reference Point Session Dosage Given: 2.66 Gy
Session Number: 11

## 2022-08-25 ENCOUNTER — Ambulatory Visit
Admission: RE | Admit: 2022-08-25 | Discharge: 2022-08-25 | Disposition: A | Discharge status: Home / Self Care | Payer: BC Managed Care – PPO | Source: Ambulatory Visit | Attending: Radiation Oncology | Admitting: Radiation Oncology

## 2022-08-25 ENCOUNTER — Other Ambulatory Visit: Payer: Self-pay

## 2022-08-25 DIAGNOSIS — Z510 Encounter for antineoplastic radiation therapy: Secondary | ICD-10-CM | POA: Diagnosis not present

## 2022-08-25 LAB — RAD ONC ARIA SESSION SUMMARY
Course Elapsed Days: 15
Plan Fractions Treated to Date: 12
Plan Prescribed Dose Per Fraction: 2.66 Gy
Plan Total Fractions Prescribed: 16
Plan Total Prescribed Dose: 42.56 Gy
Reference Point Dosage Given to Date: 31.92 Gy
Reference Point Session Dosage Given: 2.66 Gy
Session Number: 12

## 2022-08-28 ENCOUNTER — Ambulatory Visit
Admission: RE | Admit: 2022-08-28 | Discharge: 2022-08-28 | Disposition: A | Discharge status: Home / Self Care | Payer: BC Managed Care – PPO | Source: Ambulatory Visit | Attending: Radiation Oncology | Admitting: Radiation Oncology

## 2022-08-28 ENCOUNTER — Other Ambulatory Visit: Payer: Self-pay

## 2022-08-28 DIAGNOSIS — Z510 Encounter for antineoplastic radiation therapy: Secondary | ICD-10-CM | POA: Diagnosis not present

## 2022-08-28 LAB — RAD ONC ARIA SESSION SUMMARY
Course Elapsed Days: 18
Plan Fractions Treated to Date: 13
Plan Prescribed Dose Per Fraction: 2.66 Gy
Plan Total Fractions Prescribed: 16
Plan Total Prescribed Dose: 42.56 Gy
Reference Point Dosage Given to Date: 34.58 Gy
Reference Point Session Dosage Given: 2.66 Gy
Session Number: 13

## 2022-08-29 ENCOUNTER — Other Ambulatory Visit: Payer: Self-pay

## 2022-08-29 ENCOUNTER — Ambulatory Visit
Admission: RE | Admit: 2022-08-29 | Discharge: 2022-08-29 | Disposition: A | Discharge status: Home / Self Care | Payer: BC Managed Care – PPO | Source: Ambulatory Visit | Attending: Radiation Oncology | Admitting: Radiation Oncology

## 2022-08-29 DIAGNOSIS — Z510 Encounter for antineoplastic radiation therapy: Secondary | ICD-10-CM | POA: Diagnosis not present

## 2022-08-29 LAB — RAD ONC ARIA SESSION SUMMARY
Course Elapsed Days: 19
Plan Fractions Treated to Date: 14
Plan Prescribed Dose Per Fraction: 2.66 Gy
Plan Total Fractions Prescribed: 16
Plan Total Prescribed Dose: 42.56 Gy
Reference Point Dosage Given to Date: 37.24 Gy
Reference Point Session Dosage Given: 2.66 Gy
Session Number: 14

## 2022-08-30 ENCOUNTER — Ambulatory Visit
Admission: RE | Admit: 2022-08-30 | Discharge: 2022-08-30 | Disposition: A | Discharge status: Home / Self Care | Payer: BC Managed Care – PPO | Source: Ambulatory Visit | Attending: Radiation Oncology | Admitting: Radiation Oncology

## 2022-08-30 ENCOUNTER — Other Ambulatory Visit: Payer: Self-pay

## 2022-08-30 DIAGNOSIS — Z510 Encounter for antineoplastic radiation therapy: Secondary | ICD-10-CM | POA: Diagnosis not present

## 2022-08-30 LAB — RAD ONC ARIA SESSION SUMMARY
Course Elapsed Days: 20
Plan Fractions Treated to Date: 15
Plan Prescribed Dose Per Fraction: 2.66 Gy
Plan Total Fractions Prescribed: 16
Plan Total Prescribed Dose: 42.56 Gy
Reference Point Dosage Given to Date: 39.9 Gy
Reference Point Session Dosage Given: 2.66 Gy
Session Number: 15

## 2022-08-31 ENCOUNTER — Ambulatory Visit
Admission: RE | Admit: 2022-08-31 | Discharge: 2022-08-31 | Disposition: A | Discharge status: Home / Self Care | Payer: BC Managed Care – PPO | Source: Ambulatory Visit | Attending: Radiation Oncology | Admitting: Radiation Oncology

## 2022-08-31 ENCOUNTER — Other Ambulatory Visit: Payer: Self-pay

## 2022-08-31 DIAGNOSIS — Z510 Encounter for antineoplastic radiation therapy: Secondary | ICD-10-CM | POA: Diagnosis not present

## 2022-08-31 LAB — RAD ONC ARIA SESSION SUMMARY
Course Elapsed Days: 21
Plan Fractions Treated to Date: 16
Plan Prescribed Dose Per Fraction: 2.66 Gy
Plan Total Fractions Prescribed: 16
Plan Total Prescribed Dose: 42.56 Gy
Reference Point Dosage Given to Date: 42.56 Gy
Reference Point Session Dosage Given: 2.66 Gy
Session Number: 16

## 2022-09-01 ENCOUNTER — Ambulatory Visit
Admission: RE | Admit: 2022-09-01 | Discharge: 2022-09-01 | Disposition: A | Discharge status: Home / Self Care | Payer: BC Managed Care – PPO | Source: Ambulatory Visit | Attending: Radiation Oncology | Admitting: Radiation Oncology

## 2022-09-01 ENCOUNTER — Other Ambulatory Visit: Payer: Self-pay

## 2022-09-01 DIAGNOSIS — Z510 Encounter for antineoplastic radiation therapy: Secondary | ICD-10-CM | POA: Diagnosis not present

## 2022-09-01 LAB — RAD ONC ARIA SESSION SUMMARY
Course Elapsed Days: 22
Plan Fractions Treated to Date: 1
Plan Prescribed Dose Per Fraction: 2 Gy
Plan Total Fractions Prescribed: 5
Plan Total Prescribed Dose: 10 Gy
Reference Point Dosage Given to Date: 2 Gy
Reference Point Session Dosage Given: 2 Gy
Session Number: 17

## 2022-09-04 ENCOUNTER — Other Ambulatory Visit: Payer: Self-pay

## 2022-09-04 ENCOUNTER — Ambulatory Visit
Admission: RE | Admit: 2022-09-04 | Discharge: 2022-09-04 | Disposition: A | Discharge status: Home / Self Care | Payer: BC Managed Care – PPO | Source: Ambulatory Visit | Attending: Radiation Oncology | Admitting: Radiation Oncology

## 2022-09-04 ENCOUNTER — Inpatient Hospital Stay: Payer: BC Managed Care – PPO

## 2022-09-04 DIAGNOSIS — Z510 Encounter for antineoplastic radiation therapy: Secondary | ICD-10-CM | POA: Diagnosis not present

## 2022-09-04 LAB — RAD ONC ARIA SESSION SUMMARY
Course Elapsed Days: 25
Plan Fractions Treated to Date: 2
Plan Prescribed Dose Per Fraction: 2 Gy
Plan Total Fractions Prescribed: 5
Plan Total Prescribed Dose: 10 Gy
Reference Point Dosage Given to Date: 4 Gy
Reference Point Session Dosage Given: 2 Gy
Session Number: 18

## 2022-09-05 ENCOUNTER — Other Ambulatory Visit: Payer: Self-pay

## 2022-09-05 ENCOUNTER — Ambulatory Visit
Admission: RE | Admit: 2022-09-05 | Discharge: 2022-09-05 | Disposition: A | Discharge status: Home / Self Care | Payer: BC Managed Care – PPO | Source: Ambulatory Visit | Attending: Radiation Oncology | Admitting: Radiation Oncology

## 2022-09-05 DIAGNOSIS — Z510 Encounter for antineoplastic radiation therapy: Secondary | ICD-10-CM | POA: Diagnosis not present

## 2022-09-05 LAB — RAD ONC ARIA SESSION SUMMARY
Course Elapsed Days: 26
Plan Fractions Treated to Date: 3
Plan Prescribed Dose Per Fraction: 2 Gy
Plan Total Fractions Prescribed: 5
Plan Total Prescribed Dose: 10 Gy
Reference Point Dosage Given to Date: 6 Gy
Reference Point Session Dosage Given: 2 Gy
Session Number: 19

## 2022-09-06 ENCOUNTER — Other Ambulatory Visit: Payer: Self-pay

## 2022-09-06 ENCOUNTER — Ambulatory Visit
Admission: RE | Admit: 2022-09-06 | Discharge: 2022-09-06 | Disposition: A | Discharge status: Home / Self Care | Payer: BC Managed Care – PPO | Source: Ambulatory Visit | Attending: Radiation Oncology | Admitting: Radiation Oncology

## 2022-09-06 DIAGNOSIS — Z510 Encounter for antineoplastic radiation therapy: Secondary | ICD-10-CM | POA: Diagnosis not present

## 2022-09-06 LAB — RAD ONC ARIA SESSION SUMMARY
Course Elapsed Days: 27
Plan Fractions Treated to Date: 4
Plan Prescribed Dose Per Fraction: 2 Gy
Plan Total Fractions Prescribed: 5
Plan Total Prescribed Dose: 10 Gy
Reference Point Dosage Given to Date: 8 Gy
Reference Point Session Dosage Given: 2 Gy
Session Number: 20

## 2022-09-07 ENCOUNTER — Other Ambulatory Visit: Payer: Self-pay

## 2022-09-07 ENCOUNTER — Ambulatory Visit
Admission: RE | Admit: 2022-09-07 | Discharge: 2022-09-07 | Disposition: A | Discharge status: Home / Self Care | Payer: BC Managed Care – PPO | Source: Ambulatory Visit | Attending: Radiation Oncology | Admitting: Radiation Oncology

## 2022-09-07 DIAGNOSIS — Z510 Encounter for antineoplastic radiation therapy: Secondary | ICD-10-CM | POA: Diagnosis not present

## 2022-09-07 LAB — RAD ONC ARIA SESSION SUMMARY
Course Elapsed Days: 28
Plan Fractions Treated to Date: 5
Plan Prescribed Dose Per Fraction: 2 Gy
Plan Total Fractions Prescribed: 5
Plan Total Prescribed Dose: 10 Gy
Reference Point Dosage Given to Date: 10 Gy
Reference Point Session Dosage Given: 2 Gy
Session Number: 21

## 2022-09-11 ENCOUNTER — Ambulatory Visit: Payer: BC Managed Care – PPO | Admitting: Hematology and Oncology

## 2022-09-11 ENCOUNTER — Telehealth: Payer: Self-pay | Admitting: Hematology and Oncology

## 2022-09-11 NOTE — Telephone Encounter (Signed)
patient called to verify time and date of appointment.  

## 2022-09-12 ENCOUNTER — Other Ambulatory Visit: Payer: Self-pay

## 2022-09-12 ENCOUNTER — Inpatient Hospital Stay (HOSPITAL_BASED_OUTPATIENT_CLINIC_OR_DEPARTMENT_OTHER): Payer: BC Managed Care – PPO | Admitting: Hematology and Oncology

## 2022-09-12 VITALS — BP 110/57 | HR 102 | Temp 97.5°F | Resp 18 | Ht 64.0 in | Wt 114.0 lb

## 2022-09-12 DIAGNOSIS — Z170 Estrogen receptor positive status [ER+]: Secondary | ICD-10-CM

## 2022-09-12 DIAGNOSIS — C50511 Malignant neoplasm of lower-outer quadrant of right female breast: Secondary | ICD-10-CM | POA: Diagnosis present

## 2022-09-12 DIAGNOSIS — Z923 Personal history of irradiation: Secondary | ICD-10-CM | POA: Insufficient documentation

## 2022-09-12 DIAGNOSIS — Z780 Asymptomatic menopausal state: Secondary | ICD-10-CM

## 2022-09-12 MED ORDER — ANASTROZOLE 1 MG PO TABS: 1.0000 mg | ORAL_TABLET | Freq: Every day | ORAL | 3 refills | Status: DC

## 2022-09-12 MED ORDER — OYSTER SHELL CALCIUM/D3 500-5 MG-MCG PO TABS: 1.0000 | ORAL_TABLET | Freq: Two times a day (BID) | ORAL | 3 refills | Status: AC

## 2022-09-12 NOTE — Assessment & Plan Note (Signed)
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 These include but not limited to insomnia, hot flashes, mood changes, vaginal dryness, bone density loss, and weight gain. We strongly believe that the benefits far outweigh the risks. Patient understands these risks and consented to starting treatment. Planned treatment duration is 5-7 years.  Return to clinic in 3 months for survivorship care plan visit

## 2022-09-12 NOTE — Progress Notes (Addendum)
Patient Care Team: Kerman Passey, MD as PCP - General (Family Medicine) Donnelly Angelica, RN as Registered Nurse Pershing Proud, RN as Oncology Nurse Navigator Serena Croissant, MD as Consulting Physician (Hematology and Oncology) Griselda Miner, MD as Consulting Physician (General Surgery) Lonie Peak, MD as Attending Physician (Radiation Oncology) Carmina Miller, MD as Consulting Physician (Radiation Oncology)  DIAGNOSIS:  Encounter Diagnoses  Name Primary?   Malignant neoplasm of lower-outer quadrant of right breast of female, estrogen receptor positive (HCC) Yes   Postmenopausal     SUMMARY OF ONCOLOGIC HISTORY: Oncology History  Malignant neoplasm of lower-outer quadrant of right breast of female, estrogen receptor positive (HCC)  05/22/2022 Initial Diagnosis   04/21/2022: Screening mammogram detected right breast mass measuring 0.9 cm (workup performed at [facility]) biopsy revealed grade 2 IDC with DCIS ER 100%, PR 100%, HER2 1+: Negative   05/31/2022 Cancer Staging   Staging form: Breast, AJCC 8th Edition - Clinical stage from 05/31/2022: Stage IA (cT1b, cN0, cM0, G2, ER+, PR+, HER2-) - Signed by Lonie Peak, MD on 05/31/2022 Stage prefix: Initial diagnosis Histologic grading system: 3 grade system    Genetic Testing   [facility] Multi-Cancer Panel+RNA was Negative. Of note, a variant of uncertain significance was detected in the MBD4 gene (c.336C>T (Silent)) and POLE gene ([id number]). Report date is 06/12/2022.  The Multi-Cancer + RNA Panel offered by [facility] includes sequencing and/or deletion/duplication analysis of the following 70 genes:  AIP*, ALK, APC*, ATM*, AXIN2*, BAP1*, BARD1*, BLM*, BMPR1A*, BRCA1*, BRCA2*, BRIP1*, CDC73*, CDH1*, CDK4, CDKN1B*, CDKN2A, CHEK2*, CTNNA1*, DICER1*, EPCAM (del/dup only), EGFR, FH*, FLCN*, GREM1 (promoter dup only), HOXB13, KIT, LZTR1, MAX*, MBD4, MEN1*, MET, MITF, MLH1*, MSH2*, MSH3*, MSH6*, MUTYH*, NF1*, NF2*, NTHL1*, PALB2*, PDGFRA,  PMS2*, POLD1*, POLE*, POT1*, PRKAR1A*, PTCH1*, PTEN*, RAD51C*, RAD51D*, RB1*, RET, SDHA* (sequencing only), SDHAF2*, SDHB*, SDHC*, SDHD*, SMAD4*, SMARCA4*, SMARCB1*, SMARCE1*, STK11*, SUFU*, TMEM127*, TP53*, TSC1*, TSC2*, VHL*. RNA analysis is performed for * genes.   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 female is here because of recent diagnosis of right breast cancer. She presents to the clinic for a follow-up after radiation to discuss adjuvant antiestrogen therapy with anastrozole. She reports radiation went well. She states that the lymph nodes are really tender. She does feel a sharp pain at the surgical site.   ALLERGIES:  is allergic to covid-19 (mrna) vaccine and tetracycline.  MEDICATIONS:  Current Outpatient Medications  Medication Sig Dispense Refill   ALPRAZolam (XANAX) 0.5 MG tablet Take by mouth.     anastrozole (ARIMIDEX) 1 MG tablet Take 1 tablet (1 mg total) by mouth daily. 90 tablet 3   calcium-vitamin D (OSCAL WITH D) 500-5 MG-MCG tablet Take 1 tablet by mouth 2 (two) times daily. 180 tablet 3   lansoprazole (PREVACID) 30 MG capsule Take 1 capsule (30 mg total) by mouth daily. 90 capsule 0   promethazine-dextromethorphan (PROMETHAZINE-DM) 6.25-15 MG/5ML syrup Take by mouth.     No current facility-administered medications for this visit.    PHYSICAL EXAMINATION: ECOG PERFORMANCE STATUS: 1 - Symptomatic but completely ambulatory  Vitals:   09/12/22 1430  BP: (!) 110/57  Pulse: (!) 102  Resp: 18  Temp: (!) 97.5 F (36.4 C)  SpO2: 100%   Filed Weights   09/12/22  1430  Weight: 114 lb (51.7 kg)      LABORATORY DATA:  I have reviewed the data as listed    Latest Ref Rng & Units 12/02/2015    8:36 AM 11/25/2014   11:50 AM 01/09/2013    5:51 PM  CMP  Glucose 65 - 99 mg/dL 90  84  91   BUN 6 - 24 mg/dL 14  11  15    Creatinine 0.57 - 1.00 mg/dL 1.61  0.96  0.45   Sodium 134 - 144 mmol/L 143  145  138   Potassium 3.5 - 5.2 mmol/L 4.4  4.7  4.0   Chloride 96 - 106 mmol/L 104  104  105   CO2 18 - 29 mmol/L 24  25  29    Calcium 8.7 - 10.2 mg/dL 9.4  9.9  9.1   Total Protein 6.0 - 8.5 g/dL 6.4  6.6  7.0   Total Bilirubin 0.0 - 1.2 mg/dL 0.3  <4.0  0.2   Alkaline Phos 39 - 117 IU/L 65  71  80   AST 0 - 40 IU/L 20  20  28    ALT 0 - 32 IU/L 19  17  33     Lab Results  Component Value Date   WBC 5.1 12/02/2015   HGB 13.0 12/02/2015   HCT 37.2 12/02/2015   MCV 85 12/02/2015   PLT 251 12/02/2015   NEUTROABS 2.8 12/02/2015    ASSESSMENT & PLAN:  Malignant neoplasm of lower-outer quadrant of right breast of female, estrogen receptor positive (HCC) 06/28/2022:Right lumpectomy: 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 These include but not limited to insomnia, hot flashes, mood changes, vaginal dryness, bone density loss, and weight gain. We strongly believe that the benefits far outweigh the risks. Patient understands these risks and consented to starting treatment. Planned treatment duration is 5-7 years.  We will obtain a baseline bone density test in the next couple of months. Return to clinic in 3 months for survivorship care plan  visit    Orders Placed This Encounter  Procedures   DG Bone Density    Standing Status:   Future    Standing Expiration Date:   09/12/2023    Order Specific Question:   Reason for Exam (SYMPTOM  OR DIAGNOSIS REQUIRED)    Answer:   postmenopausal    Order Specific Question:   Preferred imaging location?    Answer:   [facility]   The patient has a good understanding of the overall plan. she agrees with it. she will call with any problems that may develop before the next visit here. Total time spent: 30 mins including face to face time and time spent for planning, charting and co-ordination of care   Tamsen Meek, MD 09/12/22    I Janan Ridge am acting as a Neurosurgeon for Dr.Lakedra Washington  I have reviewed the above documentation  for accuracy and completeness, and I agree with the above.

## 2022-10-02 ENCOUNTER — Ambulatory Visit: Payer: BC Managed Care – PPO

## 2022-10-12 ENCOUNTER — Ambulatory Visit: Payer: BC Managed Care – PPO | Attending: Radiation Oncology | Admitting: Radiation Oncology

## 2022-10-12 DIAGNOSIS — C50511 Malignant neoplasm of lower-outer quadrant of right female breast: Secondary | ICD-10-CM | POA: Insufficient documentation

## 2022-10-12 DIAGNOSIS — Z510 Encounter for antineoplastic radiation therapy: Secondary | ICD-10-CM | POA: Insufficient documentation

## 2022-11-17 ENCOUNTER — Ambulatory Visit (HOSPITAL_BASED_OUTPATIENT_CLINIC_OR_DEPARTMENT_OTHER)
Admission: RE | Admit: 2022-11-17 | Discharge: 2022-11-17 | Disposition: A | Discharge status: Home / Self Care | Payer: BC Managed Care – PPO | Source: Ambulatory Visit | Attending: Hematology and Oncology | Admitting: Hematology and Oncology

## 2022-11-17 DIAGNOSIS — C50511 Malignant neoplasm of lower-outer quadrant of right female breast: Secondary | ICD-10-CM | POA: Insufficient documentation

## 2022-11-17 DIAGNOSIS — Z170 Estrogen receptor positive status [ER+]: Secondary | ICD-10-CM | POA: Insufficient documentation

## 2022-11-17 DIAGNOSIS — Z780 Asymptomatic menopausal state: Secondary | ICD-10-CM | POA: Diagnosis present

## 2022-12-15 ENCOUNTER — Inpatient Hospital Stay: Payer: BC Managed Care – PPO | Attending: Adult Health | Admitting: Adult Health

## 2022-12-29 ENCOUNTER — Encounter: Payer: Self-pay | Admitting: *Deleted

## 2023-01-03 ENCOUNTER — Telehealth: Payer: Self-pay | Admitting: Adult Health

## 2023-01-03 NOTE — Telephone Encounter (Signed)
 Left patient a message in regards to scheduled appointment times/dates

## 2023-02-06 ENCOUNTER — Encounter: Payer: Self-pay | Admitting: Adult Health

## 2023-02-06 ENCOUNTER — Inpatient Hospital Stay: Payer: BC Managed Care – PPO | Attending: Adult Health | Admitting: Adult Health

## 2023-02-06 VITALS — BP 116/54 | HR 71 | Temp 98.8°F | Resp 17 | Wt 115.7 lb

## 2023-02-06 DIAGNOSIS — Z170 Estrogen receptor positive status [ER+]: Secondary | ICD-10-CM | POA: Diagnosis not present

## 2023-02-06 DIAGNOSIS — C50511 Malignant neoplasm of lower-outer quadrant of right female breast: Secondary | ICD-10-CM | POA: Diagnosis present

## 2023-02-06 DIAGNOSIS — Z79811 Long term (current) use of aromatase inhibitors: Secondary | ICD-10-CM | POA: Diagnosis not present

## 2023-02-06 DIAGNOSIS — Z124 Encounter for screening for malignant neoplasm of cervix: Secondary | ICD-10-CM

## 2023-02-06 MED ORDER — ALENDRONATE SODIUM 70 MG PO TABS: 70.0000 mg | ORAL_TABLET | ORAL | 3 refills | Status: AC

## 2023-02-06 NOTE — Progress Notes (Signed)
SURVIVORSHIP VISIT:  BRIEF ONCOLOGIC HISTORY:  Oncology History  Malignant neoplasm of lower-outer quadrant of right breast of female, estrogen receptor positive (HCC)  05/22/2022 Initial Diagnosis   04/21/2022: Screening mammogram detected right breast mass measuring 0.9 cm (workup performed at [facility]) biopsy revealed grade 2 IDC with DCIS ER 100%, PR 100%, HER2 1+: Negative   05/31/2022 Cancer Staging   Staging form: Breast, AJCC 8th Edition - Clinical stage from 05/31/2022: Stage IA (cT1b, cN0, cM0, G2, ER+, PR+, HER2-) - Signed by Lonie Peak, MD on 05/31/2022 Stage prefix: Initial diagnosis Histologic grading system: 3 grade system    Genetic Testing   [facility] Multi-Cancer Panel+RNA was Negative. Of note, a variant of uncertain significance was detected in the MBD4 gene (c.336C>T (Silent)) and POLE gene ([id number]). Report date is 06/12/2022.  The Multi-Cancer + RNA Panel offered by [facility] includes sequencing and/or deletion/duplication analysis of the following 70 genes:  AIP*, ALK, APC*, ATM*, AXIN2*, BAP1*, BARD1*, BLM*, BMPR1A*, BRCA1*, BRCA2*, BRIP1*, CDC73*, CDH1*, CDK4, CDKN1B*, CDKN2A, CHEK2*, CTNNA1*, DICER1*, EPCAM (del/dup only), EGFR, FH*, FLCN*, GREM1 (promoter dup only), HOXB13, KIT, LZTR1, MAX*, MBD4, MEN1*, MET, MITF, MLH1*, MSH2*, MSH3*, MSH6*, MUTYH*, NF1*, NF2*, NTHL1*, PALB2*, PDGFRA, PMS2*, POLD1*, POLE*, POT1*, PRKAR1A*, PTCH1*, PTEN*, RAD51C*, RAD51D*, RB1*, RET, SDHA* (sequencing only), SDHAF2*, SDHB*, SDHC*, SDHD*, SMAD4*, SMARCA4*, SMARCB1*, SMARCE1*, STK11*, SUFU*, TMEM127*, TP53*, TSC1*, TSC2*, VHL*. RNA analysis is performed for * genes.   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     Overall, Christine Ayala reports feeling well today.  She notes that she was initially nauseated with anastrozole and stopped taking this medication approximately two weeks ago. Her most recent bone density testing occurred on 11/17/2022 and demonstrated osteoporosis with a t score of -3.2 in the AP spine.   REVIEW OF SYSTEMS:  Review of Systems  Constitutional:  Negative for appetite change, chills, fatigue, fever and unexpected weight change.  HENT:   Negative for hearing loss, lump/mass and trouble swallowing.   Eyes:  Negative for eye problems and icterus.  Respiratory:  Negative for chest tightness, cough and shortness of breath.   Cardiovascular:  Negative for chest pain, leg swelling and palpitations.  Gastrointestinal:  Negative for abdominal distention, abdominal pain, constipation, diarrhea, nausea and vomiting.  Endocrine: Negative for hot flashes.  Genitourinary:  Negative for difficulty urinating.   Musculoskeletal:  Negative for arthralgias.  Skin:  Negative for itching and rash.  Neurological:  Negative for dizziness, extremity weakness, headaches and numbness.  Hematological:  Negative for adenopathy. Does not bruise/bleed easily.  Psychiatric/Behavioral:  Negative for depression. The patient is not nervous/anxious.   Breast: Denies any new nodularity, masses, tenderness, nipple changes, or nipple discharge.       PAST MEDICAL/SURGICAL HISTORY:  Past Medical History:  Diagnosis Date   Anemia    Depression    Family history of breast cancer    genetic testing letter sent 2/19  GERD (gastroesophageal reflux disease)    Headache    migraines/sinus   Herpes genitalis    PONV  (postoperative nausea and vomiting)    Past Surgical History:  Procedure Laterality Date   BREAST BIOPSY  06/27/2022   MM RT RADIOACTIVE SEED LOC MAMMO GUIDE 06/27/2022 [facility] MAMMOGRAPHY   BREAST LUMPECTOMY WITH RADIOACTIVE SEED AND SENTINEL LYMPH NODE BIOPSY Right 06/28/2022   Procedure: RIGHT BREAST LUMPECTOMY WITH RADIOACTIVE SEED AND SENTINEL LYMPH NODE BIOPSY;  Surgeon: Griselda Miner, MD;  Location: [facility];  Service: General;  Laterality: Right;   CESAREAN SECTION  1978   COLONOSCOPY WITH PROPOFOL N/A 05/14/2015   Procedure: COLONOSCOPY WITH PROPOFOL;  Surgeon: Midge Minium, MD;  Location[facility];  Service: Endoscopy;  Laterality: N/A;   FOOT SURGERY     WRIST SURGERY       ALLERGIES:  Allergies  Allergen Reactions   Covid-19 (Mrna) Vaccine Other (See Comments)    [name] reports feeling ill   Tetracycline Nausea And Vomiting     CURRENT MEDICATIONS:  Outpatient Encounter Medications as of 02/06/2023  Medication Sig Note   ALPRAZolam (XANAX) 0.5 MG tablet Take by mouth.    anastrozole (ARIMIDEX) 1 MG tablet Take 1 tablet (1 mg total) by mouth daily.    calcium-vitamin D (OSCAL WITH D) 500-5 MG-MCG tablet Take 1 tablet by mouth 2 (two) times daily.    lansoprazole (PREVACID) 30 MG capsule Take 1 capsule (30 mg total) by mouth daily. 06/19/2017: As needed    promethazine-dextromethorphan (PROMETHAZINE-DM) 6.25-15 MG/5ML syrup Take by mouth.    No facility-administered encounter medications on file as of 02/06/2023.     ONCOLOGIC FAMILY HISTORY:  Family History  Problem Relation Age of Onset   Heart disease Mother    Hyperlipidemia Mother    Hypertension Mother    Pneumonia Sister    Schizophrenia Sister    Breast cancer Maternal Aunt 93   Breast cancer Paternal Aunt 30   Breast cancer Paternal Aunt    Breast cancer Paternal Grandmother 37 - 75   Leukemia Paternal Grandfather    Leukemia Nephew 35     SOCIAL HISTORY:  Social History    Socioeconomic History   Marital status: Married    Spouse name: Not on file   Number of children: Not on file   Years of education: Not on file   Highest education level: Not on file  Occupational History   Not on file  Tobacco Use   Smoking status: Former   Smokeless tobacco: Never   Tobacco comments:    quit 20 yrs ago  Vaping Use   Vaping status: Never Used  Substance and Sexual Activity   Alcohol use: No    Alcohol/week: 0.0 standard drinks of alcohol   Drug use: No   Sexual activity: Yes    Birth control/protection: None  Other Topics Concern   Not on file  Social History Narrative   Not on file   Social Determinants of Health   Financial Resource Strain: High Risk (07/19/2022)   [facility]   Overall Financial Resource Strain (CARDIA)    Difficulty of Paying Living Expenses: Very hard  Food Insecurity: No Food Insecurity (07/26/2022)   Hunger Vital Sign    Worried About Running Out of Food in the Last Year: Never true    Ran Out of Food in the Last Year: Never true  Transportation Needs: No Transportation Needs (07/26/2022)  PRAPARE - Administrator, Civil Service (Medical): No    Lack of Transportation (Non-Medical): No  Physical Activity: Insufficiently Active (06/19/2017)   Exercise Vital Sign    Days of Exercise per Week: 5 days    Minutes of Exercise per Session: 10 min  Stress: No Stress Concern Present (06/19/2017)   [employer] of Occupational Health - Occupational Stress Questionnaire    Feeling of Stress : Not at all  Social Connections: Socially Integrated (06/19/2017)   Social Connection and Isolation Panel [NHANES]    Frequency of Communication with Friends and Family: More than three times a week    Frequency of Social Gatherings with Friends and Family: Once a week    Attends Religious Services: More than 4 times per year    Active Member of [employer] or Organizations: Yes    Attends Museum/gallery exhibitions officer: More than 4 times per year    Marital Status: Married  Catering manager Violence: Not At Risk (07/26/2022)   Humiliation, Afraid, Rape, and Kick questionnaire    Fear of Current or Ex-Partner: No    Emotionally Abused: No    Physically Abused: No    Sexually Abused: No     OBSERVATIONS/OBJECTIVE:  BP (!) 116/54 (Patient Position: Sitting)   Pulse 71   Temp 98.8 F (37.1 C) (Temporal)   Resp 17   Wt 115 lb 11.2 oz (52.5 kg)   SpO2 96%   BMI 19.86 kg/m  GENERAL: Patient is a well appearing female in no acute distress HEENT:  Sclerae anicteric.  Oropharynx clear and moist. No ulcerations or evidence of oropharyngeal candidiasis. Neck is supple.  NODES:  No cervical, supraclavicular, or axillary lymphadenopathy palpated.  BREAST EXAM:  Right breast s/p lumpectomy and radiation, no sign of local recurrence, left breast benign LUNGS:  Clear to auscultation bilaterally.  No wheezes or rhonchi. HEART:  Regular rate and rhythm. No murmur appreciated. ABDOMEN:  Soft, nontender.  Positive, normoactive bowel sounds. No organomegaly palpated. MSK:  No focal spinal tenderness to palpation. Full range of motion bilaterally in the upper extremities. EXTREMITIES:  No peripheral edema.   SKIN:  Clear with no obvious rashes or skin changes. No nail dyscrasia. NEURO:  Nonfocal. Well oriented.  Appropriate affect.   LABORATORY DATA:  None for this visit.  DIAGNOSTIC IMAGING:  None for this visit.      ASSESSMENT AND PLAN:  Ms.. Ayala is a pleasant 67 y.o. female with Stage IA right breast invasive ductal carcinoma, ER+/PR+/HER2-, diagnosed in 05/2022, treated with lumpectomy, adjuvant radiation therapy, and anti-estrogen therapy with Anastrozole beginning in 10/2022 (stopped for nausea).  She presents to the Survivorship Clinic for our initial meeting and routine follow-up post-completion of treatment for breast cancer.    1. Stage IA right breast cancer:  Christine Ayala is  continuing to recover from definitive treatment for breast cancer. She will follow-up with her medical oncologist, Dr.  Pamelia Hoit in 6 months with history and physical exam per surveillance protocol. She has experienced difficulty with Anastrozole due to nausea.  We reviewed risks of foregoing AET altogether along with changing to a differing AET such as letrozole or exemestane.  She will see how the next few weeks go and will reach out if she wants to take a different AET. Her mammogram is due 04/2023; orders placed today.   Today, a comprehensive survivorship care plan and treatment summary was reviewed with the patient today detailing her breast cancer diagnosis, treatment course,  potential late/long-term effects of treatment, appropriate follow-up care with recommendations for the future, and patient education resources.  A copy of this summary, along with a letter will be sent to the patient's primary care provider via mail/fax/In Basket message after today's visit.    2. Bone health:  Given Christine Ayala's age/history of breast cancer and her current treatment regimen including anti-estrogen therapy with Anastrozole, she is at risk for bone demineralization.  She has osteoporosis and we discussed treatment strategies for osteoporosis, including oral bisphosphonates, IV therapy, and Prolia.  We reviewed risks and benefits of each treatment and I prescribed Fosamax weekly.  I recommended repeat bone density testing in 2 years time.  She was given education on specific activities to promote bone health.  3. Cancer screening:  Due to Christine Ayala's history and her age, she should receive screening for skin cancers, colon cancer, and gynecologic cancers. She has not undergone cervical cancer screening in many years, therefore I placed a referral to OB/GYN for evaluation.  The information and recommendations are listed on the patient's comprehensive care plan/treatment summary and were reviewed in detail with the patient.     4. Health maintenance and wellness promotion: Christine Ayala was encouraged to consume 5-7 servings of fruits and vegetables per day. We reviewed the "Nutrition Rainbow" handout.  She was also encouraged to engage in moderate to vigorous exercise for 30 minutes per day most days of the week.  She was instructed to limit her alcohol consumption and continue to abstain from tobacco use.     5. Support services/counseling: It is not uncommon for this period of the patient's cancer care trajectory to be one of many emotions and stressors.   She was given information regarding our available services and encouraged to contact me with any questions or for help enrolling in any of our support group/programs.    Follow up instructions:    -Return to cancer center in 6 months for f/u with Dr. Pamelia Hoit  -Mammogram due in 04/2023 -She is welcome to return back to the Survivorship Clinic at any time; no additional follow-up needed at this time.  -Consider referral back to survivorship as a long-term survivor for continued surveillance  The patient was provided an opportunity to ask questions and all were answered. The patient agreed with the plan and demonstrated an understanding of the instructions.   Total encounter time:45 minutes*in face-to-face visit time, chart review, lab review, care coordination, order entry, and documentation of the encounter time.    Lillard Anes, NP 02/06/23 3:50 PM Medical Oncology and Hematology [facility] [address] [place], [place] [address] Tel. [phone]    Fax. [phone]  *Total Encounter Time as defined by the Centers for Medicare and Medicaid Services includes, in addition to the face-to-face time of a patient visit (documented in the note above) non-face-to-face time: obtaining and reviewing outside history, ordering and reviewing medications, tests or procedures, care coordination (communications with other health care professionals or  caregivers) and documentation in the medical record.

## 2023-03-26 ENCOUNTER — Telehealth: Payer: Self-pay

## 2023-03-26 NOTE — Telephone Encounter (Signed)
Pt calling; recently dx'd c Breast Cancer(01/2023); has had radiation and is on medication; was told she needs to have a pap smear done.  Is calling to get it scheduled.  Adv she would be a new pt to [place] since we haven't seen her in >3 years.  Adv will send msg to schedulers and to watch for their call.  Also, adv pt she wasn't alone in having breast cancer that I have it too; adv pt if she needs someone to talk to to call me.  A good number for pt is [phone]

## 2023-03-27 NOTE — Telephone Encounter (Signed)
Contacted the patient via phone. She is scheduled for 11/26 with Dr Lonny Prude

## 2023-04-09 NOTE — Progress Notes (Unsigned)
    GYNECOLOGY PROGRESS NOTE  Subjective:  PCP: Kerman Passey, MD  Patient ID: Christine Ayala, female    DOB: September 01, 1955, 67 y.o.   MRN: [id number]  HPI  Patient is a 67 y.o. [id number] female who presents for a pap smear, oncologist wanted her to get a pap since she is currently being treated for breast cancer. Pt thinks she may have a yeast infection, is experiencing 65wks of vaginal itching. She was started on a new medication per her oncologist and has since had a strong vaginal odor. She then started douching every other day, which led to the itching. Denies discharge or vaginal pain.   Prior paps per the records: 06/19/17 -NILM/ Positive HPV 07/24/14- NILM /Positive HPV  The following portions of the patient's history were reviewed and updated as appropriate: allergies, current medications, past family history, past medical history, past social history, past surgical history, and problem list.  Review of Systems Pertinent items are noted in HPI.   Objective:   Blood pressure 120/80, pulse 100, height 5\' 3"  (1.6 m), weight 115 lb (52.2 kg). Body mass index is 20.37 kg/m.  General appearance: alert, cooperative, and no distress Abdomen: soft, non-tender; bowel sounds normal; no masses,  no organomegaly Pelvic: cervix normal in appearance, external genitalia normal, no adnexal masses or tenderness, no cervical motion tenderness, positive findings: vaginal mucosa atrophic, rectovaginal septum normal, uterus normal size, shape, and consistency, and vagina normal without discharge Extremities: extremities normal, atraumatic, no cyanosis or edema Neurologic: Grossly normal   Assessment/Plan:   1. Cervical cancer screening   2. Atrophic vaginitis   3. Vaginal itching   4. Malignant neoplasm of lower-outer quadrant of right breast of female, estrogen receptor positive (HCC)   5. Cervical high risk HPV (human papillomavirus) test positive     -Pap obtained today, will follow up with results.  Should continue screens until 2 NILM/HPV- results, then can consider ageing out of recommendation.  -Swab obtained for the itching, but most likely due to pt's recent douching. Odor occurring after a new medication initiation can be normal in the absence of other symptoms. Will treat if indicated, but counseled to stop douching and let things reset.  -Pt atrophic, but due to her current estrogen-positive breast cancer, would not use topical replacement. Discussed vaginal moisturizers that she can try for the dryness.    Julieanne Manson, DO [facility]

## 2023-04-10 ENCOUNTER — Other Ambulatory Visit (HOSPITAL_COMMUNITY)
Admission: RE | Admit: 2023-04-10 | Discharge: 2023-04-10 | Disposition: A | Discharge status: Home / Self Care | Payer: BC Managed Care – PPO | Source: Ambulatory Visit | Attending: Obstetrics | Admitting: Obstetrics

## 2023-04-10 ENCOUNTER — Encounter: Payer: Self-pay | Admitting: Obstetrics

## 2023-04-10 ENCOUNTER — Ambulatory Visit: Payer: BC Managed Care – PPO | Admitting: Obstetrics

## 2023-04-10 VITALS — BP 120/80 | HR 100 | Ht 63.0 in | Wt 115.0 lb

## 2023-04-10 DIAGNOSIS — Z170 Estrogen receptor positive status [ER+]: Secondary | ICD-10-CM | POA: Diagnosis present

## 2023-04-10 DIAGNOSIS — R87810 Cervical high risk human papillomavirus (HPV) DNA test positive: Secondary | ICD-10-CM | POA: Diagnosis not present

## 2023-04-10 DIAGNOSIS — Z124 Encounter for screening for malignant neoplasm of cervix: Secondary | ICD-10-CM | POA: Diagnosis present

## 2023-04-10 DIAGNOSIS — C50511 Malignant neoplasm of lower-outer quadrant of right female breast: Secondary | ICD-10-CM

## 2023-04-10 DIAGNOSIS — N952 Postmenopausal atrophic vaginitis: Secondary | ICD-10-CM | POA: Insufficient documentation

## 2023-04-10 DIAGNOSIS — N898 Other specified noninflammatory disorders of vagina: Secondary | ICD-10-CM

## 2023-04-10 NOTE — Patient Instructions (Signed)
There are several treatment options for vaginal dryness. Some, such as vaginal moisturizers or lubricants, are available without a prescription.  Vaginal lubricants and moisturizers -- You can buy these without a prescription in most pharmacies. Vaginal lubricants and moisturizers do not contain any hormones and have virtually no systemic (body-wide) side effects. Possible local side effects include irritation or a burning feeling after application.  ?Lubricants are designed to reduce friction and discomfort from dryness during sexual intercourse. The lubricant is applied inside the vagina and/or on the partner's penis or fingers just before sex. Products sold specifically as vaginal lubricants are more effective than lubricants that are not designed for this purpose, such as petroleum jelly. In addition, oil-based lubricants (like petroleum jelly, baby oil, or mineral oil) can damage latex condoms and/or diaphragms and make them less effective in preventing pregnancy or sexually transmitted infections. Lubricants that are made with water or silicones can be used with latex condoms and diaphragms. Polyurethane condoms can be used with oil-based products. Natural lubricants, such as olive, coconut, avocado, or peanut oil, are easily available products that may be used as a lubricant with sex. However, it is important to know that, like the oil-based lubricants, natural oils are not recommended for use with latex condoms or diaphragms, as they can damage the latex. Water- or silicone-based lubricants are a better choice if you use condoms or a diaphragm.  ?Vaginal moisturizers usually include hyaluronic acid and are formulated to allow the vaginal tissues to retain moisture more effectively. Moisturizers are applied into the vagina (as a gel or suppository) approximately three times weekly to allow a continuous moisturizing effect. Be sure to check the label to ensure that you are purchasing  a moisturizer and not a lubricant. These can be found at your local drug stores and online.  Both lubricants and moisturizers are also available in preservative-free forms.  Hand and body lotions and moisturizers should not be used to relieve vaginal dryness since they can be irritating to the vaginal tissues

## 2023-04-13 LAB — CERVICOVAGINAL ANCILLARY ONLY
Candida Glabrata: NEGATIVE
Candida Vaginitis: NEGATIVE
Comment: NEGATIVE
Comment: NEGATIVE

## 2023-04-18 LAB — CYTOLOGY - PAP
Comment: NEGATIVE
High risk HPV: NEGATIVE

## 2023-04-19 NOTE — Progress Notes (Signed)
Pt aware sent to front desk to schedule.

## 2023-05-01 ENCOUNTER — Ambulatory Visit: Payer: BC Managed Care – PPO | Admitting: Obstetrics

## 2023-05-01 ENCOUNTER — Encounter: Payer: Self-pay | Admitting: Obstetrics

## 2023-05-01 VITALS — BP 120/80 | HR 87 | Ht 67.0 in | Wt 115.0 lb

## 2023-05-01 DIAGNOSIS — Z712 Person consulting for explanation of examination or test findings: Secondary | ICD-10-CM | POA: Diagnosis not present

## 2023-05-01 NOTE — Progress Notes (Signed)
    GYNECOLOGY PROGRESS NOTE  Subjective:  PCP: Kerman Passey, MD  Patient ID: Christine Ayala, female    DOB: 11-25-1955, 67 y.o.   MRN: [id number]  HPI  Patient is a 67 y.o. [id number] female who presents for a follow up from her pap smear on 04/10/23 to discuss results and next steps. Results are Atrophic pattern with epithelial atypia   The following portions of the patient's history were reviewed and updated as appropriate: allergies, current medications, past family history, past medical history, past social history, past surgical history, and problem list.  Review of Systems Pertinent items are noted in HPI.   Objective:   Blood pressure 120/80, pulse 87, height 5\' 7"  (1.702 m), weight 115 lb (52.2 kg). Body mass index is 18.01 kg/m.  General appearance: alert and cooperative Extremities: extremities normal, atraumatic, no cyanosis or edema Neurologic: Grossly normal  Assessment/Plan:   1. Encounter to discuss test results   We discussed her pap result showing atrophic pattern with epithelial atypia. This is often seen in postmenopausal women due to low estrogen levels and can sometimes mimic more serious conditions like cervical cancer. Sometimes we repeat the pap smear after estrogen therapy, but shared decision to avoid estrogen due to pt's hx of breast cancer. Reassurance that this ia benign finding related to hormonal changes rather than a cancerous lesion. She needs one more pap to be benign before she can age out of cervical cancer screening, due to pap with NILM/HPV+ in 2019. Follow up 1 year for annual, sooner prn.    Julieanne Manson, DO [facility]

## 2023-07-24 ENCOUNTER — Telehealth: Payer: Self-pay | Admitting: Hematology and Oncology

## 2023-07-24 NOTE — Telephone Encounter (Signed)
 Scheduled appointment incoming call by the patient. Patient is aware of the made appointment.

## 2023-08-06 ENCOUNTER — Inpatient Hospital Stay: Attending: Hematology and Oncology | Admitting: Hematology and Oncology

## 2023-08-06 VITALS — BP 108/57 | HR 64 | Temp 99.2°F | Resp 17 | Ht 67.0 in | Wt 111.8 lb

## 2023-08-06 DIAGNOSIS — C50511 Malignant neoplasm of lower-outer quadrant of right female breast: Secondary | ICD-10-CM | POA: Diagnosis present

## 2023-08-06 DIAGNOSIS — Z170 Estrogen receptor positive status [ER+]: Secondary | ICD-10-CM | POA: Insufficient documentation

## 2023-08-06 DIAGNOSIS — Z79811 Long term (current) use of aromatase inhibitors: Secondary | ICD-10-CM | POA: Diagnosis not present

## 2023-08-06 MED ORDER — LETROZOLE 2.5 MG PO TABS: 2.5000 mg | ORAL_TABLET | Freq: Every day | ORAL | 3 refills | Status: DC

## 2023-08-06 NOTE — Progress Notes (Signed)
 Patient Care Team: Kerman Passey, MD as PCP - General (Family Medicine) Serena Croissant, MD as Consulting Physician (Hematology and Oncology) Griselda Miner, MD as Consulting Physician (General Surgery) Lonie Peak, MD as Attending Physician (Radiation Oncology) Carmina Miller, MD as Consulting Physician (Radiation Oncology)  DIAGNOSIS:  Encounter Diagnosis  Name Primary?   Malignant neoplasm of lower-outer quadrant of right breast of female, estrogen receptor positive (HCC) Yes    SUMMARY OF ONCOLOGIC HISTORY: Oncology History  Malignant neoplasm of lower-outer quadrant of right breast of female, estrogen receptor positive (HCC)  05/22/2022 Initial Diagnosis   04/21/2022: Screening mammogram detected right breast mass measuring 0.9 cm (workup performed at [facility]) biopsy revealed grade 2 IDC with DCIS ER 100%, PR 100%, HER2 1+: Negative   05/31/2022 Cancer Staging   Staging form: Breast, AJCC 8th Edition - Clinical stage from 05/31/2022: Stage IA (cT1b, cN0, cM0, G2, ER+, PR+, HER2-) - Signed by Lonie Peak, MD on 05/31/2022 Stage prefix: Initial diagnosis Histologic grading system: 3 grade system    Genetic Testing   [facility] Multi-Cancer Panel+RNA was Negative. 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 Report date is 06/12/2022.  The Multi-Cancer + RNA Panel offered by [facility] includes sequencing and/or deletion/duplication analysis of the following 70 genes:  AIP*, ALK, APC*, ATM*, AXIN2*, BAP1*, BARD1*, BLM*, BMPR1A*, BRCA1*, BRCA2*, BRIP1*, CDC73*, CDH1*, CDK4, CDKN1B*, CDKN2A, CHEK2*, CTNNA1*, DICER1*, EPCAM (del/dup only), EGFR, FH*, FLCN*, GREM1 (promoter dup only), HOXB13, KIT, LZTR1, MAX*, MBD4, MEN1*, MET, MITF, MLH1*, MSH2*, MSH3*, MSH6*, MUTYH*, NF1*, NF2*, NTHL1*, PALB2*, PDGFRA, PMS2*, POLD1*, POLE*, POT1*, PRKAR1A*, PTCH1*, PTEN*, RAD51C*, RAD51D*, RB1*, RET, SDHA* (sequencing only),  SDHAF2*, SDHB*, SDHC*, SDHD*, SMAD4*, SMARCA4*, SMARCB1*, SMARCE1*, STK11*, SUFU*, TMEM127*, TP53*, TSC1*, TSC2*, VHL*. RNA analysis is performed for * genes.   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  History of Present Illness The patient, with a history of breast cancer treated with lumpectomy and anastrozole, presents with weight loss and stomach discomfort. She describes the stomach discomfort as a 'shaking' or 'nervous twitch' sensation, which is not associated with cramping or diarrhea. She has been experiencing this sensation every other day. Despite maintaining a regular diet, she has lost 10 pounds over the past year, dropping from 120 to 110 pounds. She has also noticed a decrease in clothing size from a 6 to a 7.  In addition to the anastrozole, the patient is taking Prevacid for stomach acid, which she uses when the discomfort becomes severe. She has not been consuming probiotics regularly, which may be contributing to her stomach discomfort.  The patient also has a history of breast cancer, for which she underwent a lumpectomy on the right side. She has not had a mammogram since her surgery and was under the impression that she would not need further mammograms.     ALLERGIES:  is allergic to covid-19 (mrna) vaccine and  tetracycline.  MEDICATIONS:  Current Outpatient Medications  Medication Sig Dispense Refill   alendronate (FOSAMAX) 70 MG tablet Take 1 tablet (70  mg total) by mouth once a week. Take with a full glass of water on an empty stomach. 12 tablet 3   ALPRAZolam (XANAX) 0.5 MG tablet Take by mouth.     anastrozole (ARIMIDEX) 1 MG tablet Take 1 tablet (1 mg total) by mouth daily. 90 tablet 3   calcium-vitamin D (OSCAL WITH D) 500-5 MG-MCG tablet Take 1 tablet by mouth 2 (two) times daily. 180 tablet 3   lansoprazole (PREVACID) 30 MG capsule Take 1 capsule (30 mg total) by mouth daily. (Patient not taking: Reported on 05/01/2023) 90 capsule 0   promethazine-dextromethorphan (PROMETHAZINE-DM) 6.25-15 MG/5ML syrup Take by mouth.     No current facility-administered medications for this visit.    PHYSICAL EXAMINATION: ECOG PERFORMANCE STATUS: 1 - Symptomatic but completely ambulatory  Vitals:   08/06/23 1008  BP: (!) 108/57  Pulse: 64  Resp: 17  Temp: 99.2 F (37.3 C)  SpO2: 99%   Filed Weights   08/06/23 1008  Weight: 111 lb 12.8 oz (50.7 kg)    Physical Exam   (exam performed in the presence of a chaperone)  LABORATORY DATA:  I have reviewed the data as listed    Latest Ref Rng & Units 12/02/2015    8:36 AM 11/25/2014   11:50 AM 01/09/2013    5:51 PM  CMP  Glucose 65 - 99 mg/dL 90  84  91   BUN 6 - 24 mg/dL 14  11  15    Creatinine 0.57 - 1.00 mg/dL 8.41  3.24  4.01   Sodium 134 - 144 mmol/L 143  145  138   Potassium 3.5 - 5.2 mmol/L 4.4  4.7  4.0   Chloride 96 - 106 mmol/L 104  104  105   CO2 18 - 29 mmol/L 24  25  29    Calcium 8.7 - 10.2 mg/dL 9.4  9.9  9.1   Total Protein 6.0 - 8.5 g/dL 6.4  6.6  7.0   Total Bilirubin 0.0 - 1.2 mg/dL 0.3  <0.2  0.2   Alkaline Phos 39 - 117 IU/L 65  71  80   AST 0 - 40 IU/L 20  20  28    ALT 0 - 32 IU/L 19  17  33     Lab Results  Component Value Date   WBC 5.1 12/02/2015   HGB 13.0 12/02/2015   HCT 37.2 12/02/2015   MCV 85 12/02/2015   PLT 251 12/02/2015   NEUTROABS 2.8 12/02/2015    ASSESSMENT & PLAN:  Malignant neoplasm of lower-outer quadrant of right breast of female,  estrogen receptor positive (HCC) 06/28/2022:Right lumpectomy: 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  Breast cancer surveillance: Breast exam 08/06/2023: Benign Mammogram to be done 04/16/2023 Bone density 11/17/2022: T-score -3.2: Osteoporosis: Currently taking calcium vitamin D and Fosamax.  Recheck bone density in 2026.  Assessment and Plan  Assessment & Plan Malignant neoplasm of lower-outer quadrant of right breast, estrogen receptor positive She experienced atypical weight loss on anastrozole. Discussed switching to letrozole to assess impact on weight. - Switch anastrozole to letrozole. - Order mammogram at [facility].  Gastrointestinal discomfort Stomach discomfort possibly due to lack of probiotics. Discussed probiotics' role in gut health. - Add yogurt to diet to increase probiotic intake.      No orders of the defined types were placed in this encounter.  The patient has a good understanding of the overall plan. she agrees with it. she will call with any problems that may develop before the next visit here. Total time spent: 30 mins including face to face time and time spent for planning, charting and co-ordination of care   Tamsen Meek, MD 08/06/23

## 2023-08-06 NOTE — Assessment & Plan Note (Signed)
 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  Return to clinic every 6 months for Prolia injections and every yearly follow-up with me

## 2023-08-07 ENCOUNTER — Telehealth: Payer: Self-pay | Admitting: Hematology and Oncology

## 2023-08-07 NOTE — Telephone Encounter (Signed)
 Scheduled appointments per 3/24 los. Left VM with appointment details.

## 2023-08-09 ENCOUNTER — Inpatient Hospital Stay
Admission: RE | Admit: 2023-08-09 | Discharge: 2023-08-09 | Disposition: A | Discharge status: Home / Self Care | Payer: Self-pay | Source: Ambulatory Visit | Attending: Hematology and Oncology

## 2023-08-09 ENCOUNTER — Other Ambulatory Visit: Payer: Self-pay | Admitting: *Deleted

## 2023-08-09 ENCOUNTER — Other Ambulatory Visit: Payer: Self-pay | Admitting: Hematology and Oncology

## 2023-08-09 DIAGNOSIS — Z1231 Encounter for screening mammogram for malignant neoplasm of breast: Secondary | ICD-10-CM

## 2023-08-09 DIAGNOSIS — C50511 Malignant neoplasm of lower-outer quadrant of right female breast: Secondary | ICD-10-CM

## 2023-08-09 DIAGNOSIS — Z170 Estrogen receptor positive status [ER+]: Secondary | ICD-10-CM

## 2023-09-05 ENCOUNTER — Ambulatory Visit
Admission: RE | Admit: 2023-09-05 | Discharge: 2023-09-05 | Discharge status: Home / Self Care | Source: Ambulatory Visit | Attending: Hematology and Oncology

## 2023-09-05 ENCOUNTER — Ambulatory Visit
Admission: RE | Admit: 2023-09-05 | Discharge: 2023-09-05 | Disposition: A | Discharge status: Home / Self Care | Source: Ambulatory Visit | Attending: Hematology and Oncology | Admitting: Hematology and Oncology

## 2023-09-05 DIAGNOSIS — Z170 Estrogen receptor positive status [ER+]: Secondary | ICD-10-CM | POA: Insufficient documentation

## 2023-09-05 DIAGNOSIS — C50511 Malignant neoplasm of lower-outer quadrant of right female breast: Secondary | ICD-10-CM | POA: Insufficient documentation

## 2023-12-08 ENCOUNTER — Other Ambulatory Visit: Payer: Self-pay | Admitting: Hematology and Oncology

## 2024-01-18 ENCOUNTER — Other Ambulatory Visit: Payer: Self-pay | Admitting: Hematology and Oncology

## 2024-02-09 ENCOUNTER — Other Ambulatory Visit: Payer: Self-pay | Admitting: Hematology and Oncology

## 2024-02-11 ENCOUNTER — Telehealth: Payer: Self-pay

## 2024-02-11 NOTE — Telephone Encounter (Signed)
 S/w patient regarding questions about refill for letrozole .  Patient had been switched from anastrozole  to letrozole  back in March of this year. Patient reports that she was not aware of the change and has continued with the anastrozole . Patient made aware that she should start letrozole  as discussed with Dr. Gudena and that prescription is ready at [facility]. Patient verbalized an understanding of the information and voiced appreciation for the call.

## 2024-02-11 NOTE — Telephone Encounter (Signed)
 Pt was changed to  letrozole   Needs to call oncologist.

## 2024-02-11 NOTE — Telephone Encounter (Signed)
 Patient called stating that her cancer medicine was denied. Asked Patient the name of the medication and she stated that it starts with [facility].... Told Patient that according to her office note from 02/07/24, it was stated that She has been unable to receive her prescribed cancer medication, and is unsure if her medication was switched from anastrozole  to letrozole  as discussed by her cancer doctor. Informed Patient that it looks at though she is now on Letrozole . Patient stated that she was not aware of the change. Spoke with Alfonso who stated that the medication was changed from Anastrozole  to Letrozole  due to GI issues. Advised Patient to contact her Oncologist to discuss medication refill. Patient stated that she does not have an Oncologist and that her previous PCP always prescribed the medication. Patient stated that she does have a Provider in [place] who she believes has managed her cancer medication in the past and she will reach out to them. Told Patient that Alfonso stated that she will need to get her cancer medication from her Oncologist. Patient verbalized understanding.

## 2024-02-12 ENCOUNTER — Other Ambulatory Visit: Payer: Self-pay | Admitting: *Deleted

## 2024-02-12 MED ORDER — LETROZOLE 2.5 MG PO TABS: 2.5000 mg | ORAL_TABLET | Freq: Every day | ORAL | 3 refills | Status: AC

## 2024-02-12 NOTE — Telephone Encounter (Signed)
 Received call from pt requesting refill for Letrozole .  Pt states she never received original prescription.  Pt educated that prescription for a years worth was sent to pharmacy on file in March.  RN re sent medication and educated pt to pick up prescription today and to contact our office with any issues.  Pt verbalized understanding.

## 2024-08-06 ENCOUNTER — Inpatient Hospital Stay: Admitting: Hematology and Oncology
